# Patient Record
Sex: Male | Born: 1960 | Race: White | Hispanic: Yes | Marital: Married | Smoking: Former smoker
Health system: Southern US, Community
[De-identification: ages and names within clinical notes are randomized; demographics above are authoritative.]

## PROBLEM LIST (undated history)

## (undated) DIAGNOSIS — G629 Polyneuropathy, unspecified: Secondary | ICD-10-CM

## (undated) DIAGNOSIS — I10 Essential (primary) hypertension: Secondary | ICD-10-CM

## (undated) DIAGNOSIS — E039 Hypothyroidism, unspecified: Secondary | ICD-10-CM

## (undated) DIAGNOSIS — G4730 Sleep apnea, unspecified: Secondary | ICD-10-CM

## (undated) DIAGNOSIS — F419 Anxiety disorder, unspecified: Secondary | ICD-10-CM

## (undated) DIAGNOSIS — M1990 Unspecified osteoarthritis, unspecified site: Secondary | ICD-10-CM

## (undated) HISTORY — PX: KNEE ARTHROSCOPY: SUR90

## (undated) HISTORY — PX: OPEN ANTERIOR SHOULDER RECONSTRUCTION: SHX2100

## (undated) HISTORY — PX: VARICOSE VEIN SURGERY: SHX832

## (undated) HISTORY — PX: COLONOSCOPY: SHX174

## (undated) HISTORY — PX: SHOULDER ARTHROSCOPY: SHX128

---

## 2000-07-20 ENCOUNTER — Ambulatory Visit (HOSPITAL_BASED_OUTPATIENT_CLINIC_OR_DEPARTMENT_OTHER): Admission: RE | Admit: 2000-07-20 | Discharge: 2000-07-20 | Payer: Self-pay | Admitting: Orthopaedic Surgery

## 2014-10-23 ENCOUNTER — Other Ambulatory Visit: Payer: Self-pay | Admitting: Orthopedic Surgery

## 2014-10-24 ENCOUNTER — Encounter (HOSPITAL_COMMUNITY): Payer: Self-pay | Admitting: *Deleted

## 2014-10-24 ENCOUNTER — Ambulatory Visit (HOSPITAL_COMMUNITY): Payer: 59 | Admitting: Anesthesiology

## 2014-10-24 ENCOUNTER — Ambulatory Visit (HOSPITAL_COMMUNITY): Payer: 59

## 2014-10-24 ENCOUNTER — Ambulatory Visit (HOSPITAL_COMMUNITY)
Admission: RE | Admit: 2014-10-24 | Discharge: 2014-10-24 | Disposition: A | Discharge status: Home / Self Care | Payer: 59 | Source: Ambulatory Visit | Attending: Orthopedic Surgery | Admitting: Orthopedic Surgery

## 2014-10-24 ENCOUNTER — Encounter (HOSPITAL_COMMUNITY)
Admission: RE | Disposition: A | Discharge status: Home / Self Care | Payer: Self-pay | Source: Ambulatory Visit | Attending: Orthopedic Surgery

## 2014-10-24 DIAGNOSIS — I10 Essential (primary) hypertension: Secondary | ICD-10-CM | POA: Insufficient documentation

## 2014-10-24 DIAGNOSIS — Y9389 Activity, other specified: Secondary | ICD-10-CM | POA: Insufficient documentation

## 2014-10-24 DIAGNOSIS — Z87891 Personal history of nicotine dependence: Secondary | ICD-10-CM | POA: Diagnosis not present

## 2014-10-24 DIAGNOSIS — G4730 Sleep apnea, unspecified: Secondary | ICD-10-CM | POA: Diagnosis not present

## 2014-10-24 DIAGNOSIS — X58XXXA Exposure to other specified factors, initial encounter: Secondary | ICD-10-CM | POA: Insufficient documentation

## 2014-10-24 DIAGNOSIS — Z419 Encounter for procedure for purposes other than remedying health state, unspecified: Secondary | ICD-10-CM

## 2014-10-24 DIAGNOSIS — M5116 Intervertebral disc disorders with radiculopathy, lumbar region: Secondary | ICD-10-CM | POA: Diagnosis present

## 2014-10-24 DIAGNOSIS — Y9269 Other specified industrial and construction area as the place of occurrence of the external cause: Secondary | ICD-10-CM | POA: Diagnosis not present

## 2014-10-24 DIAGNOSIS — Y990 Civilian activity done for income or pay: Secondary | ICD-10-CM | POA: Diagnosis not present

## 2014-10-24 DIAGNOSIS — Z01818 Encounter for other preprocedural examination: Secondary | ICD-10-CM

## 2014-10-24 HISTORY — DX: Sleep apnea, unspecified: G47.30

## 2014-10-24 HISTORY — PX: LUMBAR LAMINECTOMY/DECOMPRESSION MICRODISCECTOMY: SHX5026

## 2014-10-24 HISTORY — DX: Essential (primary) hypertension: I10

## 2014-10-24 LAB — SURGICAL PCR SCREEN
MRSA, PCR: NEGATIVE
Staphylococcus aureus: NEGATIVE

## 2014-10-24 LAB — COMPREHENSIVE METABOLIC PANEL
ALT: 27 U/L (ref 0–53)
ANION GAP: 12 (ref 5–15)
AST: 20 U/L (ref 0–37)
Albumin: 3.9 g/dL (ref 3.5–5.2)
Alkaline Phosphatase: 57 U/L (ref 39–117)
BUN: 18 mg/dL (ref 6–23)
CALCIUM: 9.4 mg/dL (ref 8.4–10.5)
CO2: 25 mEq/L (ref 19–32)
Chloride: 101 mEq/L (ref 96–112)
Creatinine, Ser: 0.79 mg/dL (ref 0.50–1.35)
GFR calc non Af Amer: >90 mL/min (ref >90)
GLUCOSE: 103 mg/dL — AB (ref 70–99)
Potassium: 4.9 mEq/L (ref 3.7–5.3)
Sodium: 138 mEq/L (ref 137–147)
TOTAL PROTEIN: 7 g/dL (ref 6.0–8.3)
Total Bilirubin: 0.4 mg/dL (ref 0.3–1.2)

## 2014-10-24 LAB — TYPE AND SCREEN
ABO/RH(D): O POS
Antibody Screen: NEGATIVE

## 2014-10-24 LAB — CBC WITH DIFFERENTIAL/PLATELET
Basophils Absolute: 0.1 10*3/uL (ref 0.0–0.1)
Basophils Relative: 1 % (ref 0–1)
EOS ABS: 0.5 10*3/uL (ref 0.0–0.7)
Eosinophils Relative: 6 % — ABNORMAL HIGH (ref 0–5)
HCT: 41.6 % (ref 39.0–52.0)
Hemoglobin: 14.4 g/dL (ref 13.0–17.0)
LYMPHS ABS: 2.4 10*3/uL (ref 0.7–4.0)
Lymphocytes Relative: 29 % (ref 12–46)
MCH: 31.4 pg (ref 26.0–34.0)
MCHC: 34.6 g/dL (ref 30.0–36.0)
MCV: 90.6 fL (ref 78.0–100.0)
MONOS PCT: 9 % (ref 3–12)
Monocytes Absolute: 0.7 10*3/uL (ref 0.1–1.0)
Neutro Abs: 4.7 10*3/uL (ref 1.7–7.7)
Neutrophils Relative %: 55 % (ref 43–77)
Platelets: 257 10*3/uL (ref 150–400)
RBC: 4.59 MIL/uL (ref 4.22–5.81)
RDW: 13.4 % (ref 11.5–15.5)
WBC: 8.3 10*3/uL (ref 4.0–10.5)

## 2014-10-24 LAB — PROTIME-INR
INR: 0.99 (ref 0.00–1.49)
PROTHROMBIN TIME: 13.2 s (ref 11.6–15.2)

## 2014-10-24 LAB — ABO/RH: ABO/RH(D): O POS

## 2014-10-24 LAB — APTT: aPTT: 30 seconds (ref 24–37)

## 2014-10-24 SURGERY — LUMBAR LAMINECTOMY/DECOMPRESSION MICRODISCECTOMY
Anesthesia: General | Site: Back

## 2014-10-24 MED ORDER — ARTIFICIAL TEARS OP OINT
TOPICAL_OINTMENT | OPHTHALMIC | Status: AC
  Filled 2014-10-24: qty 3.5

## 2014-10-24 MED ORDER — PROPOFOL 10 MG/ML IV BOLUS
INTRAVENOUS | Status: AC
  Filled 2014-10-24: qty 20

## 2014-10-24 MED ORDER — LIDOCAINE HCL (CARDIAC) 20 MG/ML IV SOLN
INTRAVENOUS | Status: DC | PRN
  Administered 2014-10-24: 60 mg via INTRAVENOUS

## 2014-10-24 MED ORDER — GLYCOPYRROLATE 0.2 MG/ML IJ SOLN
INTRAMUSCULAR | Status: AC
  Filled 2014-10-24: qty 1

## 2014-10-24 MED ORDER — METHYLENE BLUE 1 % INJ SOLN
INTRAMUSCULAR | Status: AC
  Filled 2014-10-24: qty 10

## 2014-10-24 MED ORDER — FENTANYL CITRATE 0.05 MG/ML IJ SOLN
INTRAMUSCULAR | Status: DC | PRN
  Administered 2014-10-24 (×2): 50 ug via INTRAVENOUS
  Administered 2014-10-24: 100 ug via INTRAVENOUS
  Administered 2014-10-24 (×3): 50 ug via INTRAVENOUS

## 2014-10-24 MED ORDER — STERILE WATER FOR INJECTION IJ SOLN
INTRAMUSCULAR | Status: AC
  Filled 2014-10-24: qty 10

## 2014-10-24 MED ORDER — SURGIFOAM 100 EX MISC
CUTANEOUS | Status: DC | PRN
  Administered 2014-10-24: 09:00:00 via TOPICAL

## 2014-10-24 MED ORDER — METHYLENE BLUE 1 % INJ SOLN
INTRAMUSCULAR | Status: DC | PRN
  Administered 2014-10-24: 1 mL

## 2014-10-24 MED ORDER — GLYCOPYRROLATE 0.2 MG/ML IJ SOLN
INTRAMUSCULAR | Status: DC | PRN
  Administered 2014-10-24: 0.4 mg via INTRAVENOUS

## 2014-10-24 MED ORDER — MUPIROCIN 2 % EX OINT
TOPICAL_OINTMENT | CUTANEOUS | Status: AC
  Administered 2014-10-24: 1 via NASAL
  Filled 2014-10-24: qty 22

## 2014-10-24 MED ORDER — ONDANSETRON HCL 4 MG/2ML IJ SOLN
INTRAMUSCULAR | Status: AC
  Filled 2014-10-24: qty 2

## 2014-10-24 MED ORDER — HYDROMORPHONE HCL 1 MG/ML IJ SOLN
INTRAMUSCULAR | Status: AC
  Filled 2014-10-24: qty 1

## 2014-10-24 MED ORDER — VECURONIUM BROMIDE 10 MG IV SOLR
INTRAVENOUS | Status: AC
  Filled 2014-10-24: qty 10

## 2014-10-24 MED ORDER — METHYLPREDNISOLONE ACETATE 40 MG/ML IJ SUSP
INTRAMUSCULAR | Status: DC | PRN
Start: 2014-10-24 — End: 2014-10-24
  Administered 2014-10-24: 40 mg

## 2014-10-24 MED ORDER — DIAZEPAM 5 MG PO TABS
ORAL_TABLET | ORAL | Status: AC
  Administered 2014-10-24: 5 mg via ORAL
  Filled 2014-10-24: qty 1

## 2014-10-24 MED ORDER — ROCURONIUM BROMIDE 50 MG/5ML IV SOLN
INTRAVENOUS | Status: AC
  Filled 2014-10-24: qty 1

## 2014-10-24 MED ORDER — MIDAZOLAM HCL 5 MG/5ML IJ SOLN
INTRAMUSCULAR | Status: DC | PRN
  Administered 2014-10-24: 2 mg via INTRAVENOUS

## 2014-10-24 MED ORDER — HEMOSTATIC AGENTS (NO CHARGE) OPTIME
TOPICAL | Status: DC | PRN
  Administered 2014-10-24: 1 via TOPICAL

## 2014-10-24 MED ORDER — HYDROMORPHONE HCL 1 MG/ML IJ SOLN
INTRAMUSCULAR | Status: AC
  Administered 2014-10-24: 0.5 mg via INTRAVENOUS
  Filled 2014-10-24: qty 1

## 2014-10-24 MED ORDER — THROMBIN 20000 UNITS EX SOLR
CUTANEOUS | Status: AC
  Filled 2014-10-24: qty 20000

## 2014-10-24 MED ORDER — BUPIVACAINE-EPINEPHRINE 0.25% -1:200000 IJ SOLN
INTRAMUSCULAR | Status: DC | PRN
  Administered 2014-10-24: 15 mL

## 2014-10-24 MED ORDER — OXYCODONE HCL 5 MG/5ML PO SOLN: 5.0000 mg | Freq: Once | ORAL | Status: DC | PRN

## 2014-10-24 MED ORDER — CEFAZOLIN SODIUM-DEXTROSE 2-3 GM-% IV SOLR
INTRAVENOUS | Status: AC
  Filled 2014-10-24: qty 50

## 2014-10-24 MED ORDER — PHENYLEPHRINE 40 MCG/ML (10ML) SYRINGE FOR IV PUSH (FOR BLOOD PRESSURE SUPPORT)
PREFILLED_SYRINGE | INTRAVENOUS | Status: AC
  Filled 2014-10-24: qty 10

## 2014-10-24 MED ORDER — GLYCOPYRROLATE 0.2 MG/ML IJ SOLN
INTRAMUSCULAR | Status: AC
  Filled 2014-10-24: qty 3

## 2014-10-24 MED ORDER — CEFAZOLIN SODIUM-DEXTROSE 2-3 GM-% IV SOLR
2.0000 g | INTRAVENOUS | Status: AC
  Administered 2014-10-24: 2 g via INTRAVENOUS

## 2014-10-24 MED ORDER — SODIUM CHLORIDE 0.9 % IJ SOLN
INTRAMUSCULAR | Status: AC
  Filled 2014-10-24: qty 10

## 2014-10-24 MED ORDER — ARTIFICIAL TEARS OP OINT
TOPICAL_OINTMENT | OPHTHALMIC | Status: DC | PRN
  Administered 2014-10-24: 1 via OPHTHALMIC

## 2014-10-24 MED ORDER — FENTANYL CITRATE 0.05 MG/ML IJ SOLN
INTRAMUSCULAR | Status: AC
  Filled 2014-10-24: qty 5

## 2014-10-24 MED ORDER — EPHEDRINE SULFATE 50 MG/ML IJ SOLN
INTRAMUSCULAR | Status: AC
  Filled 2014-10-24: qty 1

## 2014-10-24 MED ORDER — NEOSTIGMINE METHYLSULFATE 10 MG/10ML IV SOLN
INTRAVENOUS | Status: AC
  Filled 2014-10-24: qty 1

## 2014-10-24 MED ORDER — GLYCOPYRROLATE 0.2 MG/ML IJ SOLN
INTRAMUSCULAR | Status: AC
Start: 2014-10-24 — End: 2014-10-24
  Filled 2014-10-24: qty 1

## 2014-10-24 MED ORDER — LIDOCAINE HCL (CARDIAC) 20 MG/ML IV SOLN
INTRAVENOUS | Status: AC
  Filled 2014-10-24: qty 5

## 2014-10-24 MED ORDER — PROPOFOL 10 MG/ML IV BOLUS
INTRAVENOUS | Status: DC | PRN
  Administered 2014-10-24: 20 mg via INTRAVENOUS
  Administered 2014-10-24: 200 mg via INTRAVENOUS
  Administered 2014-10-24: 30 mg via INTRAVENOUS

## 2014-10-24 MED ORDER — DEXAMETHASONE SODIUM PHOSPHATE 4 MG/ML IJ SOLN
INTRAMUSCULAR | Status: AC
  Filled 2014-10-24: qty 2

## 2014-10-24 MED ORDER — HYDROMORPHONE HCL 1 MG/ML IJ SOLN
0.2500 mg | INTRAMUSCULAR | Status: DC | PRN
  Administered 2014-10-24 (×3): 0.5 mg via INTRAVENOUS

## 2014-10-24 MED ORDER — ONDANSETRON HCL 4 MG/2ML IJ SOLN
INTRAMUSCULAR | Status: DC | PRN
  Administered 2014-10-24: 4 mg via INTRAVENOUS

## 2014-10-24 MED ORDER — NEOSTIGMINE METHYLSULFATE 10 MG/10ML IV SOLN
INTRAVENOUS | Status: DC | PRN
  Administered 2014-10-24: 3 mg via INTRAVENOUS

## 2014-10-24 MED ORDER — POVIDONE-IODINE 7.5 % EX SOLN
Freq: Once | CUTANEOUS | Status: DC
  Filled 2014-10-24: qty 118

## 2014-10-24 MED ORDER — MIDAZOLAM HCL 2 MG/2ML IJ SOLN
INTRAMUSCULAR | Status: AC
  Filled 2014-10-24: qty 2

## 2014-10-24 MED ORDER — DEXAMETHASONE SODIUM PHOSPHATE 4 MG/ML IJ SOLN
INTRAMUSCULAR | Status: DC | PRN
  Administered 2014-10-24: 8 mg via INTRAVENOUS

## 2014-10-24 MED ORDER — MUPIROCIN 2 % EX OINT: TOPICAL_OINTMENT | Freq: Once | CUTANEOUS | Status: DC

## 2014-10-24 MED ORDER — OXYCODONE HCL 5 MG PO TABS: 5.0000 mg | ORAL_TABLET | Freq: Once | ORAL | Status: DC | PRN

## 2014-10-24 MED ORDER — METHYLPREDNISOLONE ACETATE 40 MG/ML IJ SUSP
INTRAMUSCULAR | Status: AC
  Filled 2014-10-24: qty 1

## 2014-10-24 MED ORDER — BUPIVACAINE-EPINEPHRINE (PF) 0.25% -1:200000 IJ SOLN
INTRAMUSCULAR | Status: AC
  Filled 2014-10-24: qty 30

## 2014-10-24 MED ORDER — PHENYLEPHRINE HCL 10 MG/ML IJ SOLN
INTRAMUSCULAR | Status: DC | PRN
  Administered 2014-10-24 (×2): 40 ug via INTRAVENOUS
  Administered 2014-10-24: 80 ug via INTRAVENOUS

## 2014-10-24 MED ORDER — LACTATED RINGERS IV SOLN
INTRAVENOUS | Status: DC | PRN
  Administered 2014-10-24 (×3): via INTRAVENOUS

## 2014-10-24 MED ORDER — SUCCINYLCHOLINE CHLORIDE 20 MG/ML IJ SOLN
INTRAMUSCULAR | Status: AC
  Filled 2014-10-24: qty 1

## 2014-10-24 MED ORDER — DIAZEPAM 5 MG PO TABS
5.0000 mg | ORAL_TABLET | Freq: Once | ORAL | Status: AC
  Administered 2014-10-24: 5 mg via ORAL

## 2014-10-24 MED ORDER — VECURONIUM BROMIDE 10 MG IV SOLR
INTRAVENOUS | Status: DC | PRN
  Administered 2014-10-24: 1 mg via INTRAVENOUS
  Administered 2014-10-24: 2 mg via INTRAVENOUS

## 2014-10-24 MED ORDER — ROCURONIUM BROMIDE 100 MG/10ML IV SOLN
INTRAVENOUS | Status: DC | PRN
  Administered 2014-10-24: 50 mg via INTRAVENOUS

## 2014-10-24 SURGICAL SUPPLY — 72 items
APL SKNCLS STERI-STRIP NONHPOA (GAUZE/BANDAGES/DRESSINGS) ×1
BENZOIN TINCTURE PRP APPL 2/3 (GAUZE/BANDAGES/DRESSINGS) ×1 IMPLANT
BUR ROUND PRECISION 4.0 (BURR) ×2 IMPLANT
CANISTER SUCTION 2500CC (MISCELLANEOUS) ×2 IMPLANT
CARTRIDGE OIL MAESTRO DRILL (MISCELLANEOUS) ×1 IMPLANT
CLSR STERI-STRIP ANTIMIC 1/2X4 (GAUZE/BANDAGES/DRESSINGS) ×1 IMPLANT
CORDS BIPOLAR (ELECTRODE) ×2 IMPLANT
COVER SURGICAL LIGHT HANDLE (MISCELLANEOUS) ×2 IMPLANT
DIFFUSER DRILL AIR PNEUMATIC (MISCELLANEOUS) ×2 IMPLANT
DRAIN CHANNEL 15F RND FF W/TCR (WOUND CARE) IMPLANT
DRAPE POUCH INSTRU U-SHP 10X18 (DRAPES) ×4 IMPLANT
DRAPE SURG 17X23 STRL (DRAPES) ×8 IMPLANT
DURAPREP 26ML APPLICATOR (WOUND CARE) ×2 IMPLANT
ELECT BLADE 4.0 EZ CLEAN MEGAD (MISCELLANEOUS) ×2
ELECT CAUTERY BLADE 6.4 (BLADE) ×2 IMPLANT
ELECT REM PT RETURN 9FT ADLT (ELECTROSURGICAL) ×2
ELECTRODE BLDE 4.0 EZ CLN MEGD (MISCELLANEOUS) IMPLANT
ELECTRODE REM PT RTRN 9FT ADLT (ELECTROSURGICAL) ×1 IMPLANT
EVACUATOR SILICONE 100CC (DRAIN) IMPLANT
FILTER STRAW FLUID ASPIR (MISCELLANEOUS) ×2 IMPLANT
GAUZE SPONGE 4X4 12PLY STRL (GAUZE/BANDAGES/DRESSINGS) ×2 IMPLANT
GAUZE SPONGE 4X4 16PLY XRAY LF (GAUZE/BANDAGES/DRESSINGS) ×4 IMPLANT
GLOVE BIO SURGEON STRL SZ7 (GLOVE) ×2 IMPLANT
GLOVE BIO SURGEON STRL SZ8 (GLOVE) ×2 IMPLANT
GLOVE BIOGEL PI IND STRL 7.0 (GLOVE) ×1 IMPLANT
GLOVE BIOGEL PI IND STRL 8 (GLOVE) ×1 IMPLANT
GLOVE BIOGEL PI INDICATOR 7.0 (GLOVE) ×1
GLOVE BIOGEL PI INDICATOR 8 (GLOVE) ×1
GOWN STRL REUS W/ TWL LRG LVL3 (GOWN DISPOSABLE) ×1 IMPLANT
GOWN STRL REUS W/ TWL XL LVL3 (GOWN DISPOSABLE) ×2 IMPLANT
GOWN STRL REUS W/TWL LRG LVL3 (GOWN DISPOSABLE) ×2
GOWN STRL REUS W/TWL XL LVL3 (GOWN DISPOSABLE) ×4
IV CATH 14GX2 1/4 (CATHETERS) ×2 IMPLANT
KIT BASIN OR (CUSTOM PROCEDURE TRAY) ×2 IMPLANT
KIT POSITION SURG JACKSON T1 (MISCELLANEOUS) ×2 IMPLANT
KIT ROOM TURNOVER OR (KITS) ×2 IMPLANT
NDL 18GX1X1/2 (RX/OR ONLY) (NEEDLE) ×1 IMPLANT
NDL HYPO 25GX1X1/2 BEV (NEEDLE) ×1 IMPLANT
NDL SPNL 18GX3.5 QUINCKE PK (NEEDLE) ×2 IMPLANT
NEEDLE 18GX1X1/2 (RX/OR ONLY) (NEEDLE) ×2 IMPLANT
NEEDLE 22X1 1/2 (OR ONLY) (NEEDLE) ×2 IMPLANT
NEEDLE HYPO 25GX1X1/2 BEV (NEEDLE) ×2 IMPLANT
NEEDLE SPNL 18GX3.5 QUINCKE PK (NEEDLE) ×4 IMPLANT
NS IRRIG 1000ML POUR BTL (IV SOLUTION) ×2 IMPLANT
OIL CARTRIDGE MAESTRO DRILL (MISCELLANEOUS) ×2
PACK LAMINECTOMY ORTHO (CUSTOM PROCEDURE TRAY) ×2 IMPLANT
PACK UNIVERSAL I (CUSTOM PROCEDURE TRAY) ×2 IMPLANT
PAD ARMBOARD 7.5X6 YLW CONV (MISCELLANEOUS) ×4 IMPLANT
PATTIES SURGICAL .5 X.5 (GAUZE/BANDAGES/DRESSINGS) ×1 IMPLANT
PATTIES SURGICAL .5 X1 (DISPOSABLE) ×2 IMPLANT
SPONGE INTESTINAL PEANUT (DISPOSABLE) ×2 IMPLANT
SPONGE SURGIFOAM ABS GEL 100 (HEMOSTASIS) ×2 IMPLANT
SPONGE SURGIFOAM ABS GEL SZ50 (HEMOSTASIS) ×2 IMPLANT
STRIP CLOSURE SKIN 1/2X4 (GAUZE/BANDAGES/DRESSINGS) ×1 IMPLANT
SURGIFLO TRUKIT (HEMOSTASIS) IMPLANT
SUT MNCRL AB 4-0 PS2 18 (SUTURE) ×2 IMPLANT
SUT VIC AB 0 CT1 18XCR BRD 8 (SUTURE) IMPLANT
SUT VIC AB 0 CT1 27 (SUTURE) ×2
SUT VIC AB 0 CT1 27XBRD ANBCTR (SUTURE) IMPLANT
SUT VIC AB 0 CT1 8-18 (SUTURE) ×2
SUT VIC AB 1 CT1 18XCR BRD 8 (SUTURE) ×1 IMPLANT
SUT VIC AB 1 CT1 8-18 (SUTURE) ×2
SUT VIC AB 2-0 CT2 18 VCP726D (SUTURE) ×2 IMPLANT
SYR 20CC LL (SYRINGE) IMPLANT
SYR BULB IRRIGATION 50ML (SYRINGE) ×2 IMPLANT
SYR CONTROL 10ML LL (SYRINGE) ×4 IMPLANT
SYR TB 1ML 26GX3/8 SAFETY (SYRINGE) ×4 IMPLANT
SYR TB 1ML LUER SLIP (SYRINGE) ×4 IMPLANT
TOWEL OR 17X24 6PK STRL BLUE (TOWEL DISPOSABLE) ×2 IMPLANT
TOWEL OR 17X26 10 PK STRL BLUE (TOWEL DISPOSABLE) ×2 IMPLANT
WATER STERILE IRR 1000ML POUR (IV SOLUTION) ×2 IMPLANT
YANKAUER SUCT BULB TIP NO VENT (SUCTIONS) ×2 IMPLANT

## 2014-10-24 NOTE — Transfer of Care (Signed)
Immediate Anesthesia Transfer of Care Note  Patient: [name] [name]  Procedure(s) Performed: Procedure(s) with comments: LUMBAR LAMINECTOMY/DECOMPRESSION MICRODISCECTOMY (N/A) - Lumbar 5-sacrum 1 decompression  Patient Location: PACU  Anesthesia Type:General  Level of Consciousness: awake, alert , oriented and patient cooperative  Airway & Oxygen Therapy: Patient Spontanous Breathing and Patient connected to face mask oxygen  Post-op Assessment: Report given to PACU RN and Post -op Vital signs reviewed and stable  Post vital signs: Reviewed  Complications: No apparent anesthesia complications

## 2014-10-24 NOTE — Anesthesia Preprocedure Evaluation (Addendum)
Anesthesia Evaluation  Patient identified by MRN, date of birth, ID band Patient awake    Reviewed: Allergy & Precautions, H&P , NPO status , Patient's Chart, lab work & pertinent test results  History of Anesthesia Complications Negative for: history of anesthetic complications  Airway Mallampati: II  TM Distance: >3 FB Neck ROM: Full    Dental no notable dental hx. (+) Teeth Intact, Dental Advisory Given, Partial Upper   Pulmonary sleep apnea and Continuous Positive Airway Pressure Ventilation , former smoker,  breath sounds clear to auscultation  Pulmonary exam normal       Cardiovascular hypertension, On Medications negative cardio ROS  Rhythm:Regular Rate:Normal     Neuro/Psych negative neurological ROS  negative psych ROS   GI/Hepatic negative GI ROS, Neg liver ROS,   Endo/Other  negative endocrine ROS  Renal/GU negative Renal ROS  negative genitourinary   Musculoskeletal   Abdominal   Peds  Hematology negative hematology ROS (+)   Anesthesia Other Findings   Reproductive/Obstetrics negative OB ROS                           Anesthesia Physical Anesthesia Plan  ASA: III  Anesthesia Plan: General   Post-op Pain Management:    Induction: Intravenous  Airway Management Planned: Oral ETT  Additional Equipment:   Intra-op Plan:   Post-operative Plan: Extubation in OR  Informed Consent: I have reviewed the patients History and Physical, chart, labs and discussed the procedure including the risks, benefits and alternatives for the proposed anesthesia with the patient or authorized representative who has indicated his/her understanding and acceptance.   Dental advisory given  Plan Discussed with: CRNA  Anesthesia Plan Comments:        Anesthesia Quick Evaluation

## 2014-10-24 NOTE — Anesthesia Procedure Notes (Signed)
Procedure Name: Intubation Date/Time: 10/24/2014 8:38 AM Performed by: Lovie CholOCK, Kinsler Soeder K Pre-anesthesia Checklist: Patient identified, Emergency Drugs available, Suction available, Patient being monitored and Timeout performed Patient Re-evaluated:Patient Re-evaluated prior to inductionOxygen Delivery Method: Circle system utilized Preoxygenation: Pre-oxygenation with 100% oxygen Intubation Type: IV induction Ventilation: Mask ventilation without difficulty and Oral airway inserted - appropriate to patient size Laryngoscope Size: Miller and 3 Grade View: Grade I Tube type: Oral Tube size: 7.5 mm Number of attempts: 1 Airway Equipment and Method: Stylet Placement Confirmation: ETT inserted through vocal cords under direct vision,  positive ETCO2,  CO2 detector and breath sounds checked- equal and bilateral Secured at: 22 cm Tube secured with: Tape Dental Injury: Teeth and Oropharynx as per pre-operative assessment  Comments: Soft bite block utilized.

## 2014-10-24 NOTE — Op Note (Signed)
NAMHeloise [name]:  [name], [name]             ACCOUNT NO.:  [id number]  MEDICAL RECORD NO.:  [id number]  LOCATION:  [facility]                         FACILITY:  [facility]  PHYSICIAN:  Estill BambergMark Shevy Yaney, MD      DATE OF BIRTH:  1961-03-22  DATE OF PROCEDURE:  10/24/2014 DATE OF DISCHARGE:  10/24/2014                              OPERATIVE REPORT   PREOPERATIVE DIAGNOSES: 1. Severe right-sided L5 radiculopathy. 2. Right-sided L5-S1 extruded disk herniation with severe compression     of the exiting L5 nerve and the neural foramen on the right side.  POSTOPERATIVE DIAGNOSES: 1. Severe right-sided L5 radiculopathy. 2. Right-sided L5-S1 extruded disk herniation with severe compression     of the exiting L5 nerve and the neural foramen on the right side.  SURGEON:  Estill BambergMark Aleister Lady, MD  ASSISTANT:  Jason CoopKayla McKenzie, PA-C  ANESTHESIA:  General endotracheal anesthesia.  COMPLICATIONS:  None.  DISPOSITION:  Stable.  ESTIMATED BLOOD LOSS:  Minimal.  INDICATIONS FOR SURGERY:  Briefly, Mr. [name] HarpsHernandez is a very pleasant 53- year-old male who did sustain a work injury on September 27, 2014, after lifting a heavy box at work.  He felt a pop in his low back and went onto have right leg pain.  An MRI did clearly revealed an extruded disk fragment at the L5-S1 level, migrated into the neural foramen and lateral to the neural foramen on the right side.  This clearly was causing severe compression on the L5 nerve.  The patient did go forward with multiple forms of conservative care and did continue to have pain. We therefore did discuss proceeding with an L5-S1 decompressive procedure.  The patient did elect to proceed.  OPERATIVE DETAILS:  On October 24, 2014, the patient was brought to Surgery and general endotracheal anesthesia was administered.  The patient was placed prone on a well-padded flat Jackson bed with a spinal frame.  Antibiotics were given.  A midline incision was made over the L5- S1 interspace.  The  fascia was incised in the midline and the paraspinal musculature was retracted.  The spinous process of L5 was removed, then a bilateral partial facetectomy was performed at L5-S1.  I then performed a lateral partial facetectomy on the right side, in order to allow myself the ability to gain access to the foraminal and extraforaminal region.  I was ultimately able to identify the exiting L5 nerve, which clearly was noted to be under substantial tension, and clearly was compressed against the L5 pedicle secondary to a disk herniation into the neuroforaminal area.  I then proceeded with freeing the L5 nerve from the herniated disk fragment.  This was successfully performed.  I then was able to remove all compression of the exiting L5 nerve by removing multiple intervertebral disk fragments located in the neural foramen.  I did use a up-angled micropituitary rongeur to continue the decompression into the far extraforaminal region to ensure adequate decompression of the nerve.  I was very pleased with the decompression that I was able to accomplish.  The wound was then copiously irrigated.  Bleeding was controlled using bipolar electrocautery in addition to SurgiFlo.  A 40 mg of Depo-Medrol was infiltrated about the epidural space in  the region of the L5 nerve.  The fascia was then closed using a normal #1 Vicryl, the subcutaneous layer was closed using 2-0 Vicryl, and the skin was closed using 3-0 Monocryl. Benzoin and Steri-Strips were applied followed by sterile dressing.  Jason CoopKayla McKenzie, PA-C was my assistant throughout surgery, did aid in retraction, suctioning and closure.     Estill BambergMark Alexi Geibel, MD     MD/MEDQ  D:  10/24/2014  T:  10/24/2014  Job:  [id number]

## 2014-10-24 NOTE — H&P (Signed)
     PREOPERATIVE H&P  Chief Complaint: right leg pain  HPI: [name] [name] is a 53 y.o. male who presents with ongoing pain in the right leg s/p injury on 101/5  MRI reveals stenosis at L5/S1  Patient has failed multiple forms of conservative care and continues to have severe pain (see office notes for additional details regarding the patient's full course of treatment)  Past Medical History  Diagnosis Date  . Hypertension   . Sleep apnea    Past Surgical History  Procedure Laterality Date  . Shoulder arthroscopy  5-10 years ago    both shoulder  . Knee arthroscopy  5-10 years ago     both knees  . Open anterior shoulder reconstruction  10 years ago  . Varicose vein surgery Bilateral 4 yrs ago    History   Social History  . Marital Status: Married    Spouse Name: N/A    Number of Children: N/A  . Years of Education: N/A   Social History Main Topics  . Smoking status: Former Smoker    Types: Cigarettes  . Smokeless tobacco: Not on file  . Alcohol Use: No  . Drug Use: No  . Sexual Activity: Not on file   Other Topics Concern  . Not on file   Social History Narrative  . No narrative on file   No family history on file. No Known Allergies Prior to Admission medications   Medication Sig Start Date End Date Taking? Authorizing Provider  ALPRAZolam Prudy Feeler(XANAX) 0.5 MG tablet Take 1 mg by mouth daily.   Yes Historical Provider, MD  atorvastatin (LIPITOR) 20 MG tablet Take 20 mg by mouth daily.   Yes Historical Provider, MD  celecoxib (CELEBREX) 200 MG capsule Take 200 mg by mouth daily.   Yes Historical Provider, MD  gabapentin (NEURONTIN) 300 MG capsule Take 300 mg by mouth 3 (three) times daily.   Yes Historical Provider, MD  levothyroxine (SYNTHROID, LEVOTHROID) 150 MCG tablet Take 150 mcg by mouth daily before breakfast.   Yes Historical Provider, MD  lisinopril-hydrochlorothiazide (PRINZIDE,ZESTORETIC) 20-12.5 MG per tablet Take 1 tablet by mouth daily.   Yes  Historical Provider, MD  oxyCODONE-acetaminophen (PERCOCET/ROXICET) 5-325 MG per tablet Take 1 tablet by mouth every 4 (four) hours as needed for severe pain (pain).   Yes Historical Provider, MD     All other systems have been reviewed and were otherwise negative with the exception of those mentioned in the HPI and as above.  Physical Exam: Filed Vitals:   10/24/14 0707  BP: 135/89  Pulse: 87  Temp: 98 F (36.7 C)  Resp: 20    General: Alert, no acute distress Cardiovascular: No pedal edema Respiratory: No cyanosis, no use of accessory musculature Skin: No lesions in the area of chief complaint Neurologic: Sensation intact distally Psychiatric: Patient is competent for consent with normal mood and affect Lymphatic: No axillary or cervical lymphadenopathy  MUSCULOSKELETAL: + SLR on right  Assessment/Plan: Right leg pain and low back pain Plan for Procedure(s): LUMBAR LAMINECTOMY L5/S1   Emilee HeroUMONSKI,Nayla Dias LEONARD, MD 10/24/2014 8:10 AM

## 2014-10-24 NOTE — Anesthesia Postprocedure Evaluation (Signed)
  Anesthesia Post-op Note  Patient: [name] [name]  Procedure(s) Performed: Procedure(s) with comments: LUMBAR LAMINECTOMY/DECOMPRESSION MICRODISCECTOMY (N/A) - Lumbar 5-sacrum 1 decompression  Patient Location: PACU  Anesthesia Type:General  Level of Consciousness: awake and alert   Airway and Oxygen Therapy: Patient Spontanous Breathing  Post-op Pain: moderate  Post-op Assessment: Post-op Vital signs reviewed, Patient's Cardiovascular Status Stable and Respiratory Function Stable  Post-op Vital Signs: Reviewed  Filed Vitals:   10/24/14 1218  BP: 130/78  Pulse: 85  Temp:   Resp: 16    Complications: No apparent anesthesia complications

## 2014-10-25 ENCOUNTER — Encounter (HOSPITAL_COMMUNITY): Payer: Self-pay | Admitting: Orthopedic Surgery

## 2016-09-04 ENCOUNTER — Ambulatory Visit (HOSPITAL_BASED_OUTPATIENT_CLINIC_OR_DEPARTMENT_OTHER)
Admission: RE | Admit: 2016-09-04 | Discharge: 2016-09-04 | Disposition: A | Discharge status: Home / Self Care | Payer: Worker's Compensation | Source: Ambulatory Visit | Attending: Orthopaedic Surgery | Admitting: Orthopaedic Surgery

## 2016-09-04 ENCOUNTER — Other Ambulatory Visit: Payer: Self-pay | Admitting: Orthopaedic Surgery

## 2016-09-04 ENCOUNTER — Ambulatory Visit (HOSPITAL_BASED_OUTPATIENT_CLINIC_OR_DEPARTMENT_OTHER): Payer: Worker's Compensation | Admitting: Anesthesiology

## 2016-09-04 ENCOUNTER — Encounter (HOSPITAL_BASED_OUTPATIENT_CLINIC_OR_DEPARTMENT_OTHER): Payer: Self-pay | Admitting: *Deleted

## 2016-09-04 ENCOUNTER — Encounter (HOSPITAL_BASED_OUTPATIENT_CLINIC_OR_DEPARTMENT_OTHER)
Admission: RE | Disposition: A | Discharge status: Home / Self Care | Payer: Self-pay | Source: Ambulatory Visit | Attending: Orthopaedic Surgery

## 2016-09-04 DIAGNOSIS — Z79899 Other long term (current) drug therapy: Secondary | ICD-10-CM | POA: Insufficient documentation

## 2016-09-04 DIAGNOSIS — I10 Essential (primary) hypertension: Secondary | ICD-10-CM | POA: Insufficient documentation

## 2016-09-04 DIAGNOSIS — M25512 Pain in left shoulder: Secondary | ICD-10-CM | POA: Insufficient documentation

## 2016-09-04 DIAGNOSIS — Z87891 Personal history of nicotine dependence: Secondary | ICD-10-CM | POA: Insufficient documentation

## 2016-09-04 DIAGNOSIS — G4730 Sleep apnea, unspecified: Secondary | ICD-10-CM | POA: Insufficient documentation

## 2016-09-04 HISTORY — PX: IRRIGATION AND DEBRIDEMENT SHOULDER: SHX5880

## 2016-09-04 SURGERY — IRRIGATION AND DEBRIDEMENT SHOULDER
Anesthesia: General | Site: Shoulder | Laterality: Left

## 2016-09-04 MED ORDER — ONDANSETRON HCL 4 MG/2ML IJ SOLN
4.0000 mg | Freq: Once | INTRAMUSCULAR | Status: AC
  Administered 2016-09-04: 4 mg via INTRAVENOUS

## 2016-09-04 MED ORDER — FENTANYL CITRATE (PF) 100 MCG/2ML IJ SOLN
INTRAMUSCULAR | Status: AC
  Filled 2016-09-04: qty 2

## 2016-09-04 MED ORDER — ALBUTEROL SULFATE HFA 108 (90 BASE) MCG/ACT IN AERS
INHALATION_SPRAY | RESPIRATORY_TRACT | Status: DC | PRN
  Administered 2016-09-04 (×2): 2 via RESPIRATORY_TRACT

## 2016-09-04 MED ORDER — LACTATED RINGERS IV SOLN: INTRAVENOUS | Status: DC

## 2016-09-04 MED ORDER — VANCOMYCIN HCL IN DEXTROSE 1-5 GM/200ML-% IV SOLN
1000.0000 mg | Freq: Once | INTRAVENOUS | Status: AC
  Administered 2016-09-04 (×2): 1000 mg via INTRAVENOUS

## 2016-09-04 MED ORDER — MIDAZOLAM HCL 2 MG/2ML IJ SOLN
INTRAMUSCULAR | Status: AC
  Filled 2016-09-04: qty 2

## 2016-09-04 MED ORDER — HYDROMORPHONE HCL 1 MG/ML IJ SOLN
0.2500 mg | INTRAMUSCULAR | Status: DC | PRN
  Administered 2016-09-04 (×2): 0.5 mg via INTRAVENOUS

## 2016-09-04 MED ORDER — VANCOMYCIN HCL IN DEXTROSE 1-5 GM/200ML-% IV SOLN
INTRAVENOUS | Status: AC
  Filled 2016-09-04: qty 200

## 2016-09-04 MED ORDER — PROPOFOL 10 MG/ML IV BOLUS
INTRAVENOUS | Status: AC
  Filled 2016-09-04: qty 20

## 2016-09-04 MED ORDER — OXYCODONE HCL 5 MG PO TABS
5.0000 mg | ORAL_TABLET | ORAL | Status: DC | PRN
  Administered 2016-09-04: 5 mg via ORAL

## 2016-09-04 MED ORDER — CEFAZOLIN SODIUM-DEXTROSE 2-4 GM/100ML-% IV SOLN: 2.0000 g | INTRAVENOUS | Status: DC

## 2016-09-04 MED ORDER — SCOPOLAMINE 1 MG/3DAYS TD PT72: 1.0000 | MEDICATED_PATCH | Freq: Once | TRANSDERMAL | Status: DC

## 2016-09-04 MED ORDER — SUCCINYLCHOLINE CHLORIDE 20 MG/ML IJ SOLN
INTRAMUSCULAR | Status: DC | PRN
  Administered 2016-09-04: 120 mg via INTRAVENOUS

## 2016-09-04 MED ORDER — HYDROCODONE-ACETAMINOPHEN 10-325 MG PO TABS: 1.0000 | ORAL_TABLET | ORAL | 0 refills | Status: DC | PRN

## 2016-09-04 MED ORDER — MIDAZOLAM HCL 2 MG/2ML IJ SOLN
1.0000 mg | INTRAMUSCULAR | Status: DC | PRN
  Administered 2016-09-04: 2 mg via INTRAVENOUS

## 2016-09-04 MED ORDER — DEXAMETHASONE SODIUM PHOSPHATE 4 MG/ML IJ SOLN
INTRAMUSCULAR | Status: DC | PRN
  Administered 2016-09-04: 10 mg via INTRAVENOUS

## 2016-09-04 MED ORDER — FENTANYL CITRATE (PF) 100 MCG/2ML IJ SOLN
50.0000 ug | INTRAMUSCULAR | Status: DC | PRN
  Administered 2016-09-04: 100 ug via INTRAVENOUS

## 2016-09-04 MED ORDER — PROPOFOL 10 MG/ML IV BOLUS
INTRAVENOUS | Status: DC | PRN
  Administered 2016-09-04: 300 mg via INTRAVENOUS

## 2016-09-04 MED ORDER — DOXYCYCLINE HYCLATE 50 MG PO CAPS: 100.0000 mg | ORAL_CAPSULE | Freq: Two times a day (BID) | ORAL | 0 refills | Status: AC

## 2016-09-04 MED ORDER — BUPIVACAINE-EPINEPHRINE (PF) 0.5% -1:200000 IJ SOLN
INTRAMUSCULAR | Status: AC
  Filled 2016-09-04: qty 30

## 2016-09-04 MED ORDER — SUGAMMADEX SODIUM 200 MG/2ML IV SOLN
INTRAVENOUS | Status: DC | PRN
  Administered 2016-09-04: 250 mg via INTRAVENOUS

## 2016-09-04 MED ORDER — POLYMYXIN B SULFATE 500000 UNITS IJ SOLR
INTRAMUSCULAR | Status: DC | PRN
  Administered 2016-09-04: 500 mL

## 2016-09-04 MED ORDER — LACTATED RINGERS IV SOLN
INTRAVENOUS | Status: DC
  Administered 2016-09-04 (×2): via INTRAVENOUS

## 2016-09-04 MED ORDER — HYDROMORPHONE HCL 1 MG/ML IJ SOLN
INTRAMUSCULAR | Status: AC
  Filled 2016-09-04: qty 1

## 2016-09-04 MED ORDER — CHLORHEXIDINE GLUCONATE 4 % EX LIQD: 60.0000 mL | Freq: Once | CUTANEOUS | Status: DC

## 2016-09-04 MED ORDER — POVIDONE-IODINE 10 % EX SWAB: 2.0000 "application " | Freq: Once | CUTANEOUS | Status: DC

## 2016-09-04 MED ORDER — OXYCODONE HCL 5 MG PO TABS
ORAL_TABLET | ORAL | Status: AC
  Filled 2016-09-04: qty 1

## 2016-09-04 MED ORDER — ROCURONIUM 10MG/ML (10ML) SYRINGE FOR MEDFUSION PUMP - OPTIME
INTRAVENOUS | Status: DC | PRN
  Administered 2016-09-04: 30 mg via INTRAVENOUS

## 2016-09-04 MED ORDER — SODIUM CHLORIDE 0.9 % IR SOLN
Status: DC | PRN
  Administered 2016-09-04: 9000 mL

## 2016-09-04 MED ORDER — BUPIVACAINE-EPINEPHRINE 0.5% -1:200000 IJ SOLN
INTRAMUSCULAR | Status: DC | PRN
  Administered 2016-09-04: 20 mL

## 2016-09-04 MED ORDER — LIDOCAINE HCL (CARDIAC) 20 MG/ML IV SOLN
INTRAVENOUS | Status: DC | PRN
  Administered 2016-09-04: 50 mg via INTRAVENOUS

## 2016-09-04 SURGICAL SUPPLY — 59 items
ADH SKN CLS APL DERMABOND .7 (GAUZE/BANDAGES/DRESSINGS)
APL SKNCLS STERI-STRIP NONHPOA (GAUZE/BANDAGES/DRESSINGS)
BENZOIN TINCTURE PRP APPL 2/3 (GAUZE/BANDAGES/DRESSINGS) IMPLANT
BLADE CUDA 5.5 (BLADE) IMPLANT
BLADE GREAT WHITE 4.2 (BLADE) ×2 IMPLANT
BLADE GREAT WHITE 4.2MM (BLADE) ×1
BLADE SURG 15 STRL LF DISP TIS (BLADE) IMPLANT
BLADE SURG 15 STRL SS (BLADE)
BUR VERTEX HOODED 4.5 (BURR) IMPLANT
CANNULA SHOULDER 7CM (CANNULA) ×3 IMPLANT
CANNULA TWIST IN 8.25X7CM (CANNULA) IMPLANT
CLOSURE WOUND 1/2 X4 (GAUZE/BANDAGES/DRESSINGS)
DECANTER SPIKE VIAL GLASS SM (MISCELLANEOUS) ×2 IMPLANT
DERMABOND ADVANCED (GAUZE/BANDAGES/DRESSINGS)
DERMABOND ADVANCED .7 DNX12 (GAUZE/BANDAGES/DRESSINGS) IMPLANT
DRAPE STERI 35X30 U-POUCH (DRAPES) ×3 IMPLANT
DRAPE U-SHAPE 47X51 STRL (DRAPES) ×3 IMPLANT
DRAPE U-SHAPE 76X120 STRL (DRAPES) ×6 IMPLANT
DRSG EMULSION OIL 3X3 NADH (GAUZE/BANDAGES/DRESSINGS) ×3 IMPLANT
DRSG PAD ABDOMINAL 8X10 ST (GAUZE/BANDAGES/DRESSINGS) ×3 IMPLANT
DURAPREP 26ML APPLICATOR (WOUND CARE) ×3 IMPLANT
ELECT MENISCUS 165MM 90D (ELECTRODE) IMPLANT
ELECT REM PT RETURN 9FT ADLT (ELECTROSURGICAL) ×3
ELECTRODE REM PT RTRN 9FT ADLT (ELECTROSURGICAL) ×1 IMPLANT
GAUZE SPONGE 4X4 12PLY STRL (GAUZE/BANDAGES/DRESSINGS) ×3 IMPLANT
GLOVE BIO SURGEON STRL SZ8 (GLOVE) ×6 IMPLANT
GLOVE BIOGEL PI IND STRL 8 (GLOVE) ×2 IMPLANT
GLOVE BIOGEL PI INDICATOR 8 (GLOVE) ×4
GOWN STRL REUS W/ TWL LRG LVL3 (GOWN DISPOSABLE) ×2 IMPLANT
GOWN STRL REUS W/ TWL XL LVL3 (GOWN DISPOSABLE) ×2 IMPLANT
GOWN STRL REUS W/TWL LRG LVL3 (GOWN DISPOSABLE) ×6
GOWN STRL REUS W/TWL XL LVL3 (GOWN DISPOSABLE) ×6
MANIFOLD NEPTUNE II (INSTRUMENTS) ×3 IMPLANT
NS IRRIG 1000ML POUR BTL (IV SOLUTION) IMPLANT
PACK ARTHROSCOPY DSU (CUSTOM PROCEDURE TRAY) ×3 IMPLANT
PACK BASIN DAY SURGERY FS (CUSTOM PROCEDURE TRAY) ×3 IMPLANT
PENCIL BUTTON HOLSTER BLD 10FT (ELECTRODE) IMPLANT
PROBE BIPOLAR ATHRO 135MM 90D (MISCELLANEOUS) IMPLANT
SET ARTHROSCOPY TUBING (MISCELLANEOUS) ×3
SET ARTHROSCOPY TUBING LN (MISCELLANEOUS) ×1 IMPLANT
SHEET MEDIUM DRAPE 40X70 STRL (DRAPES) ×3 IMPLANT
SLEEVE SCD COMPRESS KNEE MED (MISCELLANEOUS) ×2 IMPLANT
SLING ARM FOAM STRAP LRG (SOFTGOODS) IMPLANT
SLING ARM SM FOAM STRAP (SOFTGOODS) IMPLANT
SLING ARM XL FOAM STRAP (SOFTGOODS) ×2 IMPLANT
SPONGE LAP 4X18 X RAY DECT (DISPOSABLE) IMPLANT
STRIP CLOSURE SKIN 1/2X4 (GAUZE/BANDAGES/DRESSINGS) IMPLANT
SUCTION FRAZIER HANDLE 10FR (MISCELLANEOUS)
SUCTION TUBE FRAZIER 10FR DISP (MISCELLANEOUS) IMPLANT
SUT ETHILON 3 0 PS 1 (SUTURE) ×3 IMPLANT
SUT VIC AB 0 SH 27 (SUTURE) IMPLANT
SUT VIC AB 2-0 SH 27 (SUTURE)
SUT VIC AB 2-0 SH 27XBRD (SUTURE) IMPLANT
SUT VICRYL 4-0 PS2 18IN ABS (SUTURE) IMPLANT
SYR BULB 3OZ (MISCELLANEOUS) IMPLANT
TOWEL OR 17X24 6PK STRL BLUE (TOWEL DISPOSABLE) ×3 IMPLANT
TOWEL OR NON WOVEN STRL DISP B (DISPOSABLE) ×3 IMPLANT
WATER STERILE IRR 1000ML POUR (IV SOLUTION) ×3 IMPLANT
YANKAUER SUCT BULB TIP NO VENT (SUCTIONS) IMPLANT

## 2016-09-04 NOTE — Interval H&P Note (Signed)
History and Physical Interval Note:  09/04/2016 1:14 PM  [name] [name]  has presented today for surgery, with the diagnosis of INFECTED LEFT SHOULDER  The various methods of treatment have been discussed with the patient and family. After consideration of risks, benefits and other options for treatment, the patient has consented to  Procedure(s): IRRIGATION AND DEBRIDEMENT SHOULDER (Left) as a surgical intervention .  The patient's history has been reviewed, patient examined, no change in status, stable for surgery.  I have reviewed the patient's chart and labs.  Questions were answered to the patient's satisfaction.     Gifford Ballon G

## 2016-09-04 NOTE — H&P (Signed)
[name] [name] is an 55 y.o. male.   Chief Complaint: left shoulder pain HPI: [name] [name] continues with significant drainage from his left shoulder. He has been on ciprofloxacin for a couple of days after Keflex for about a week. He does not feel ill.  Data: The wound culture from 2 days ago is no growth and the gram strain is negative.  Past Medical History:  Diagnosis Date  . Hypertension   . Sleep apnea     Past Surgical History:  Procedure Laterality Date  . KNEE ARTHROSCOPY  5-10 years ago    both knees  . LUMBAR LAMINECTOMY/DECOMPRESSION MICRODISCECTOMY N/A 10/24/2014   Procedure: LUMBAR LAMINECTOMY/DECOMPRESSION MICRODISCECTOMY;  Surgeon: Emilee Hero, MD;  Location: [facility] OR;  Service: Orthopedics;  Laterality: N/A;  Lumbar 5-sacrum 1 decompression  . OPEN ANTERIOR SHOULDER RECONSTRUCTION  10 years ago  . SHOULDER ARTHROSCOPY  5-10 years ago   both shoulder  . VARICOSE VEIN SURGERY Bilateral 4 yrs ago     History reviewed. No pertinent family history. Social History:  reports that he has quit smoking. His smoking use included Cigarettes. He has never used smokeless tobacco. He reports that he does not drink alcohol or use drugs.  Allergies: No Known Allergies  Medications Prior to Admission  Medication Sig Dispense Refill  . ALPRAZolam (XANAX) 0.5 MG tablet Take 1 mg by mouth daily.    Marland Kitchen atorvastatin (LIPITOR) 20 MG tablet Take 20 mg by mouth daily.    . celecoxib (CELEBREX) 200 MG capsule Take 200 mg by mouth daily.    . ciprofloxacin (CIPRO) 750 MG tablet Take 750 mg by mouth 2 (two) times daily.    Marland Kitchen levothyroxine (SYNTHROID, LEVOTHROID) 150 MCG tablet Take 150 mcg by mouth daily before breakfast.    . lisinopril-hydrochlorothiazide (PRINZIDE,ZESTORETIC) 20-12.5 MG per tablet Take 1 tablet by mouth daily.    Marland Kitchen oxyCODONE-acetaminophen (PERCOCET/ROXICET) 5-325 MG per tablet Take 1 tablet by mouth every 4 (four) hours as needed for severe pain (pain).    Marland Kitchen gabapentin  (NEURONTIN) 300 MG capsule Take 300 mg by mouth 3 (three) times daily.      No results found for this or any previous visit (from the past 48 hour(s)). No results found.  Review of Systems  Musculoskeletal: Positive for joint pain.       Left shoulder  All other systems reviewed and are negative.   Blood pressure 136/78, pulse 90, temperature 99.1 F (37.3 C), temperature source Oral, resp. rate 20, height 6\' 3"  (1.905 m), weight 116.1 kg (256 lb), SpO2 98 %. Physical Exam  Constitutional: He is oriented to person, place, and time. He appears well-developed and well-nourished.  HENT:  Head: Normocephalic and atraumatic.  Eyes: Pupils are equal, round, and reactive to light.  Neck: Normal range of motion.  Cardiovascular: Normal rate and regular rhythm.   Respiratory: Effort normal.  GI: Soft.  Musculoskeletal:  Left shoulder motion is good below the horizon. He has continued thin slightly cloudy fluid draining from the anterior portal. This still has no odor. There are no red streaks.   Neurological: He is alert and oriented to person, place, and time.  Skin: Skin is warm and dry.  Psychiatric: He has a normal mood and affect. His behavior is normal. Judgment and thought content normal.     Assessment/Plan Assessment: Left shoulder drainage status post arthroscopy and cuff repair 08/20/16 and previous surgery 2010 and 2005  Plan: [name] [name] unfortunately has an emergent issue with the  left shoulder. He's been experiencing postoperative drainage despite 2 different antibiotics. The cultures have not grown anything. I'm concerned that he might have a propionibacter acnes infection and we really need to consider an emergent irrigation and debridement. At the time of the surgery we will take a close look at the repair and suture material. I plan to leave a drain for a few days and most likely to change his antibiotic to doxycycline orally postop. We will take good cultures at the time of the  surgery and make sure to alert the lab that they will need to hold these for at least 2 weeks.  Caroleena Paolini, Ginger OrganNDREW PAUL, PA-C 09/04/2016, 12:32 PM

## 2016-09-04 NOTE — Op Note (Signed)
#  [id number] 

## 2016-09-04 NOTE — Anesthesia Procedure Notes (Signed)
Procedure Name: Intubation Date/Time: 09/04/2016 1:48 PM Performed by: Zenia ResidesPAYNE, Keonte Daubenspeck D Pre-anesthesia Checklist: Patient identified, Emergency Drugs available, Suction available and Patient being monitored Patient Re-evaluated:Patient Re-evaluated prior to inductionOxygen Delivery Method: Circle system utilized Preoxygenation: Pre-oxygenation with 100% oxygen Intubation Type: IV induction Ventilation: Mask ventilation without difficulty Laryngoscope Size: Mac and 3 Grade View: Grade II Tube type: Oral Number of attempts: 1 Airway Equipment and Method: Stylet and Oral airway Placement Confirmation: ETT inserted through vocal cords under direct vision,  positive ETCO2 and breath sounds checked- equal and bilateral Secured at: 23 cm Tube secured with: Tape Dental Injury: Teeth and Oropharynx as per pre-operative assessment

## 2016-09-04 NOTE — Transfer of Care (Signed)
Immediate Anesthesia Transfer of Care Note  Patient: [name] [name]  Procedure(s) Performed: Procedure(s): IRRIGATION AND DEBRIDEMENT SHOULDER (Left)  Patient Location: PACU  Anesthesia Type:General  Level of Consciousness: awake, alert  and oriented  Airway & Oxygen Therapy: Patient Spontanous Breathing and Patient connected to face mask oxygen  Post-op Assessment: Report given to RN and Post -op Vital signs reviewed and stable  Post vital signs: Reviewed and stable  Last Vitals:  Vitals:   09/04/16 1132  BP: 136/78  Pulse: 90  Resp: 20  Temp: 37.3 C    Last Pain:  Vitals:   09/04/16 1132  TempSrc: Oral  PainSc: 3          Complications: No apparent anesthesia complications

## 2016-09-04 NOTE — Discharge Instructions (Signed)

## 2016-09-04 NOTE — Anesthesia Preprocedure Evaluation (Addendum)
Anesthesia Evaluation  Patient identified by MRN, date of birth, ID band Patient awake    Reviewed: Allergy & Precautions, NPO status   Airway Mallampati: II       Dental   Pulmonary sleep apnea , former smoker,    breath sounds clear to auscultation       Cardiovascular hypertension,  Rhythm:Regular Rate:Normal     Neuro/Psych    GI/Hepatic negative GI ROS, Neg liver ROS,   Endo/Other  negative endocrine ROS  Renal/GU negative Renal ROS     Musculoskeletal   Abdominal   Peds  Hematology   Anesthesia Other Findings   Reproductive/Obstetrics                            Anesthesia Physical Anesthesia Plan  ASA: III  Anesthesia Plan: General   Post-op Pain Management:    Induction: Intravenous  Airway Management Planned: Oral ETT  Additional Equipment:   Intra-op Plan:   Post-operative Plan:   Informed Consent:   Dental advisory given  Plan Discussed with: CRNA and Anesthesiologist  Anesthesia Plan Comments:         Anesthesia Quick Evaluation

## 2016-09-07 NOTE — Op Note (Signed)
NAMEloisa Northern:  Klemz, Dragon             ACCOUNT NO.:  [id number]  MEDICAL RECORD NO.:  [id number]  LOCATION:                                 FACILITY:  PHYSICIAN:  Lubertha Basqueeter G. Erol Flanagin, M.D.DATE OF BIRTH:  09/04/2017  DATE OF PROCEDURE:  09/04/2016 DATE OF DISCHARGE:                              OPERATIVE REPORT   PREOPERATIVE DIAGNOSIS:  Light shoulder possible infection.  POSTOPERATIVE DIAGNOSIS:  Light shoulder possible infection.  PROCEDURE:  Left shoulder arthroscopic debridement.  ANESTHESIA:  General.  ATTENDING SURGEON:  Lubertha BasquePeter G. Jerl Santosalldorf, M.D.  ASSISTANT:  Elodia FlorenceAndrew Nida, PA  INDICATION FOR PROCEDURE:  The patient is a 55 year old man 2 weeks from a left shoulder arthroscopic rotator cuff repair.  He began experiencing some drainage about a week from the procedure, this is persisted despite various oral antibiotics.  He is draining copious amounts of slightly cloudy thin fluid.  This has been cultured with no growth and negative Gram stains.  At this point, he was presumed to have an infection which is as of yet no growth versus potentially reaction to the suture material used to repair his rotator cuff.  Nevertheless with this copious drainage, he is offered a trip to the operating room for arthroscopic debridement.  Informed operative consent was obtained after discussion of possible complications including reaction to anesthesia and infection.  SUMMARY OF FINDINGS AND PROCEDURE:  Under general anesthesia, through several of his old portals a left shoulder arthroscopy was performed. Glenohumeral joint showed no degenerative change.  The rotator cuff repair seemed intact from below though a bit loose.  In the subacromial space, this was also true.  The sutures appeared intact in the cuff with some laxity of the repair.  The tissues appeared fairly devitalized. The subacromial space was filled with some erythematous tissue.  This was biopsied and sent to the lab.  I also  obtained a culture of fluid from the shoulder at the start of the case.  After some significant thought, we elected to debride this rotator cuff tear and I removed all of the suture material.  I then debrided the rotator cuff back towards the glenoid.  The shoulder was irrigated with 9 L of saline, followed by 500 mL of antibiotic-containing solution.  We then closed loosely over some Penrose drains which were left in place.  He was scheduled to go home the same day.  DESCRIPTION OF PROCEDURE:  The patient was taken to the operating suite, where general anesthetic was applied without difficulty.  He was positioned in a beach-chair position and prepped and draped in normal sterile fashion.  After administration of preop IV vancomycin and appropriate time out, an arthroscopy of the left shoulder was performed through several old portals.  Findings were as noted above and procedure consisted of cultures of fluid and soft tissue followed by a thorough irrigation and debridement and removal of all suture material.  The cuff was debrided back towards the glenoid.  The shoulder was irrigated again with a total of 9 L of saline and 500 mL of antibiotic-containing solution.  We did place 1 Penrose drain in the glenohumeral joint and one in the subacromial space.  I used a  nylon to reapproximate the portals loosely.  Adaptic was applied followed by dry gauze and ABD pads and tape.  Estimated blood loss and fluids, obtain from anesthesia records.  DISPOSITION:  The patient was extubated in the operating room and scheduled go home same day.  I will contact him by phone tonight.     Lubertha Basque Jerl Santos, M.D.   ______________________________ Lubertha Basque. Jerl Santos, M.D.    PGD/MEDQ  D:  09/04/2016  T:  09/05/2016  Job:  [id number]

## 2016-09-08 ENCOUNTER — Encounter (HOSPITAL_BASED_OUTPATIENT_CLINIC_OR_DEPARTMENT_OTHER): Payer: Self-pay | Admitting: Orthopaedic Surgery

## 2016-09-08 LAB — BODY FLUID CULTURE: Culture: NO GROWTH

## 2016-09-09 LAB — AEROBIC/ANAEROBIC CULTURE (SURGICAL/DEEP WOUND): CULTURE: NO GROWTH

## 2016-09-09 LAB — AEROBIC/ANAEROBIC CULTURE W GRAM STAIN (SURGICAL/DEEP WOUND)
Culture: NO GROWTH
Gram Stain: NONE SEEN

## 2016-09-11 NOTE — Anesthesia Postprocedure Evaluation (Signed)
Anesthesia Post Note  Patient: [name] [name]  Procedure(s) Performed: Procedure(s) (LRB): IRRIGATION AND DEBRIDEMENT SHOULDER (Left)  Patient location during evaluation: PACU Anesthesia Type: General Level of consciousness: awake Pain management: pain level controlled Respiratory status: spontaneous breathing Cardiovascular status: stable Anesthetic complications: no    Last Vitals:  Vitals:   09/04/16 1615 09/04/16 1639  BP: (!) 144/89 (!) 148/87  Pulse:  91  Resp:  18  Temp:  36.8 C    Last Pain:  Vitals:   09/07/16 1048  TempSrc:   PainSc: 2                  EDWARDS,Neta Upadhyay

## 2016-09-18 LAB — ANAEROBIC CULTURE

## 2017-08-10 ENCOUNTER — Other Ambulatory Visit: Payer: Self-pay | Admitting: Orthopedic Surgery

## 2017-08-10 DIAGNOSIS — M5416 Radiculopathy, lumbar region: Secondary | ICD-10-CM

## 2017-08-18 ENCOUNTER — Other Ambulatory Visit: Payer: Self-pay

## 2017-08-23 ENCOUNTER — Other Ambulatory Visit: Payer: Self-pay

## 2017-09-16 ENCOUNTER — Other Ambulatory Visit: Payer: Self-pay | Admitting: Orthopedic Surgery

## 2017-10-04 ENCOUNTER — Telehealth: Payer: Self-pay | Admitting: Vascular Surgery

## 2017-10-04 NOTE — Telephone Encounter (Signed)
-----   Message from Retta Macebecca J Roberts, RN sent at 10/04/2017 11:24 AM EDT ----- Regarding: appointment needed Please sched an appointment for this patient to see Dr. Arbie CookeyEarly (the 10/30 looks to be the best bet) He is for ALIF surgery on the 10/20/17. Please call and give this appt time to this patient. Thank you Devon EnergyBecky

## 2017-10-04 NOTE — Telephone Encounter (Signed)
Sched appt 10/12/17 at 4:00. Lm on hm#.

## 2017-10-05 ENCOUNTER — Other Ambulatory Visit: Payer: Self-pay | Admitting: *Deleted

## 2017-10-11 NOTE — Pre-Procedure Instructions (Addendum)
[name] [name]  10/11/2017      Your procedure is scheduled on Wednesday, November 7.  Report to [facility] Admitting at 8:45 AM                     Your surgery or procedure is scheduled for 11:45A.M.   Call this number if you have problems the morning of surgery:[phone]- pre- op desk               For any other questions, please call [phone], Monday - Friday 8 AM - 4 PM.    Remember:  Do not eat food or drink liquids after midnight Tuesday. November 6.  Take these medicines the morning of surgery with A SIP OF WATER: atorvastatin (LIPITOR) levothyroxine (SYNTHROID, LEVOTHROID) Montelukast Alprazolam Bupropion   1 week prior to surgery: DO NOT Take Aspirin, Aspirin Products (Goody Powder, Excedrin Migraine), Ibuprofen (Advil), Naproxen (Aleve),Celebrax, Vitamins and Herbal Products (ie Fish Oil)  Special instructions:   [place]- Preparing For Surgery  Before surgery, you can play an important role. Because skin is not sterile, your skin needs to be as free of germs as possible. You can reduce the number of germs on your skin by washing with CHG (chlorahexidine gluconate) Soap before surgery.  CHG is an antiseptic cleaner which kills germs and bonds with the skin to continue killing germs even after washing.  Please do not use if you have an allergy to CHG or antibacterial soaps. If your skin becomes reddened/irritated stop using the CHG.  Do not shave (including legs and underarms) for at least 48 hours prior to first CHG shower. It is OK to shave your face.  Please follow these instructions carefully.   1. Shower the NIGHT BEFORE SURGERY and the MORNING OF SURGERY with CHG.   2. If you chose to wash your hair, wash your hair first as usual with your normal shampoo.  3. After you shampoo, rinse your hair and body thoroughly to remove the shampoo.  4. Use CHG as you would any other liquid soap. You can apply CHG directly to the skin and wash  gently with a scrungie or a clean washcloth.   5. Apply the CHG Soap to your body ONLY FROM THE NECK DOWN.  Do not use on open wounds or open sores. Avoid contact with your eyes, ears, mouth and genitals (private parts). Wash Face and genitals (private parts)  with your normal soap.  6. Wash thoroughly, paying special attention to the area where your surgery will be performed.  7. Thoroughly rinse your body with warm water from the neck down.  8. DO NOT shower/wash with your normal soap after using and rinsing off the CHG Soap.  9. Pat yourself dry with a CLEAN TOWEL.  10. Wear CLEAN PAJAMAS to bed the night before surgery, wear comfortable clothes the morning of surgery  11. Place CLEAN SHEETS on your bed the night of your first shower and DO NOT SLEEP WITH PETS.  Day of Surgery:  Shower as Above Do not apply any deodorants/lotions, powders and colognes. Please wear clean clothes to the hospital/surgery center.    Do not wear jewelry, make-up or nail polish.  Do not shave 48 hours prior to surgery.  Men may shave face and neck.  Do not bring valuables to the hospital.  [facility] is not responsible for any belongings or valuables.  Contacts, dentures or bridgework may not be worn into  surgery.  Leave your suitcase in the car.  After surgery it may be brought to your room.  For patients admitted to the hospital, discharge time will be determined by your treatment team.  Please read over the following fact sheets that you were given: Pain Booklet, Patient Instructions for Mupirocin Application, Incentive Spirometry, Surgical Site Infections.

## 2017-10-12 ENCOUNTER — Encounter (HOSPITAL_COMMUNITY): Payer: Self-pay

## 2017-10-12 ENCOUNTER — Encounter (HOSPITAL_COMMUNITY)
Admission: RE | Admit: 2017-10-12 | Discharge: 2017-10-12 | Disposition: A | Discharge status: Home / Self Care | Payer: 59 | Source: Ambulatory Visit | Attending: Orthopedic Surgery | Admitting: Orthopedic Surgery

## 2017-10-12 ENCOUNTER — Ambulatory Visit (INDEPENDENT_AMBULATORY_CARE_PROVIDER_SITE_OTHER): Payer: 59 | Admitting: Vascular Surgery

## 2017-10-12 ENCOUNTER — Encounter: Payer: Self-pay | Admitting: Vascular Surgery

## 2017-10-12 VITALS — BP 125/83 | HR 91 | Temp 99.1°F | Resp 20 | Ht 75.5 in | Wt 240.0 lb

## 2017-10-12 DIAGNOSIS — M48062 Spinal stenosis, lumbar region with neurogenic claudication: Secondary | ICD-10-CM | POA: Diagnosis not present

## 2017-10-12 DIAGNOSIS — Z01818 Encounter for other preprocedural examination: Secondary | ICD-10-CM | POA: Diagnosis present

## 2017-10-12 HISTORY — DX: Polyneuropathy, unspecified: G62.9

## 2017-10-12 HISTORY — DX: Hypothyroidism, unspecified: E03.9

## 2017-10-12 HISTORY — DX: Anxiety disorder, unspecified: F41.9

## 2017-10-12 LAB — URINALYSIS, ROUTINE W REFLEX MICROSCOPIC
BILIRUBIN URINE: NEGATIVE
Glucose, UA: NEGATIVE mg/dL
HGB URINE DIPSTICK: NEGATIVE
KETONES UR: NEGATIVE mg/dL
Leukocytes, UA: NEGATIVE
Nitrite: NEGATIVE
Protein, ur: NEGATIVE mg/dL
Specific Gravity, Urine: 1.013 (ref 1.005–1.030)
pH: 5 (ref 5.0–8.0)

## 2017-10-12 LAB — CBC WITH DIFFERENTIAL/PLATELET
BASOS PCT: 0 %
Basophils Absolute: 0 10*3/uL (ref 0.0–0.1)
EOS PCT: 1 %
Eosinophils Absolute: 0.1 10*3/uL (ref 0.0–0.7)
HEMATOCRIT: 43.1 % (ref 39.0–52.0)
HEMOGLOBIN: 14.6 g/dL (ref 13.0–17.0)
LYMPHS ABS: 3.1 10*3/uL (ref 0.7–4.0)
LYMPHS PCT: 28 %
MCH: 31.5 pg (ref 26.0–34.0)
MCHC: 33.9 g/dL (ref 30.0–36.0)
MCV: 92.9 fL (ref 78.0–100.0)
MONOS PCT: 10 %
Monocytes Absolute: 1.1 10*3/uL — ABNORMAL HIGH (ref 0.1–1.0)
NEUTROS ABS: 6.8 10*3/uL (ref 1.7–7.7)
Neutrophils Relative %: 61 %
Platelets: 290 10*3/uL (ref 150–400)
RBC: 4.64 MIL/uL (ref 4.22–5.81)
RDW: 13.5 % (ref 11.5–15.5)
WBC: 11.1 10*3/uL — ABNORMAL HIGH (ref 4.0–10.5)

## 2017-10-12 LAB — COMPREHENSIVE METABOLIC PANEL
ALBUMIN: 4.3 g/dL (ref 3.5–5.0)
ALK PHOS: 69 U/L (ref 38–126)
ALT: 28 U/L (ref 17–63)
ANION GAP: 8 (ref 5–15)
AST: 27 U/L (ref 15–41)
BILIRUBIN TOTAL: 0.6 mg/dL (ref 0.3–1.2)
BUN: 27 mg/dL — ABNORMAL HIGH (ref 6–20)
CALCIUM: 9.3 mg/dL (ref 8.9–10.3)
CO2: 26 mmol/L (ref 22–32)
Chloride: 102 mmol/L (ref 101–111)
Creatinine, Ser: 1.38 mg/dL — ABNORMAL HIGH (ref 0.61–1.24)
GFR calc Af Amer: >60 mL/min (ref >60)
GFR, EST NON AFRICAN AMERICAN: 55 mL/min — AB (ref >60)
GLUCOSE: 100 mg/dL — AB (ref 65–99)
POTASSIUM: 4.2 mmol/L (ref 3.5–5.1)
Sodium: 136 mmol/L (ref 135–145)
TOTAL PROTEIN: 7.4 g/dL (ref 6.5–8.1)

## 2017-10-12 LAB — PROTIME-INR
INR: 1.06
Prothrombin Time: 13.7 seconds (ref 11.4–15.2)

## 2017-10-12 LAB — TYPE AND SCREEN
ABO/RH(D): O POS
ANTIBODY SCREEN: NEGATIVE

## 2017-10-12 LAB — SURGICAL PCR SCREEN
MRSA, PCR: NEGATIVE
Staphylococcus aureus: NEGATIVE

## 2017-10-12 LAB — APTT: APTT: 33 s (ref 24–36)

## 2017-10-12 NOTE — Progress Notes (Signed)
Referring Physician: Dr. 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  Plan for anterior lumbar exposure.  HPI: [name] [name] is a 56 y.o. male who has had lumbar pain with radicular pain into his left LE resulting in muscle waisting and interfering in his ADL's.   Other medical problems include: HTN managed with lisinopril, daily aspirin , and synthroid.  He denise DM and CAD.  He has no history of abdominal surgery.  Past Medical History:  Diagnosis Date  . Anxiety   . Hypertension   . Hypothyroidism   . Neuropathy     left foot  . Sleep apnea    Past Surgical History:  Procedure Laterality Date  . COLONOSCOPY    . IRRIGATION AND DEBRIDEMENT SHOULDER Left 09/04/2016   Procedure: IRRIGATION AND DEBRIDEMENT SHOULDER;  Surgeon: Marcene Corning, MD;  Location: [facility];  Service: Orthopedics;  Laterality: Left;  . KNEE ARTHROSCOPY Bilateral 5-10 years ago   both knees  . LUMBAR LAMINECTOMY/DECOMPRESSION MICRODISCECTOMY N/A 10/24/2014   Procedure: LUMBAR LAMINECTOMY/DECOMPRESSION MICRODISCECTOMY;  Surgeon: Emilee Hero, MD;  Location: [facility] OR;  Service: Orthopedics;  Laterality: N/A;  Lumbar 5-sacrum 1 decompression  . OPEN ANTERIOR SHOULDER RECONSTRUCTION Right 10 years ago  . SHOULDER ARTHROSCOPY  5-10 years ago   both shoulder  . VARICOSE VEIN SURGERY Bilateral 4 yrs ago     Family History  Problem Relation Age of Onset  . Diabetes Mother   . Diabetes Brother     SOCIAL HISTORY: Social History   Social History  . Marital status: Married    Spouse name: N/A  . Number of children: N/A  . Years of education: N/A   Occupational History  . Not on file.   Social History Main Topics  . Smoking status: Former Smoker    Types: Cigarettes    Quit date: 2003  . Smokeless tobacco: Never Used     Comment: was a  light smoker maybe 3 a week  . Alcohol use No  . Drug use: No  . Sexual activity: Not on file   Other Topics Concern  . Not on file   Social History Narrative  . No narrative on file    No Known Allergies  Current Outpatient Prescriptions  Medication Sig Dispense Refill  . ALPRAZolam (XANAX) 0.5 MG tablet Take 0.25 mg by mouth 2 (two) times daily as needed for sleep.     Marland Kitchen aspirin EC 81 MG tablet Take 81 mg by mouth daily.    Marland Kitchen atorvastatin (LIPITOR) 20 MG tablet Take 20 mg by mouth daily.    Marland Kitchen buPROPion (WELLBUTRIN XL) 300 MG 24 hr tablet Take 300 mg by mouth daily.    . celecoxib (CELEBREX) 200 MG capsule Take 200 mg by mouth daily.    . ciprofloxacin-hydrocortisone (CIPRO HC OTIC) OTIC suspension Place 3 drops into both ears 2 (two) times daily.    . clindamycin (CLINDAGEL) 1 % gel Apply 1 application topically 2 (two) times daily.    . cycloSPORINE (RESTASIS) 0.05 % ophthalmic emulsion Place 1 drop into both eyes 2 (two) times daily.    . diazepam (VALIUM) 5 MG tablet Take 5 mg by mouth every 6 (six) hours as needed for muscle spasms.    . diclofenac sodium (VOLTAREN) 1 % GEL Apply 2 g topically 4 (four) times daily.    Marland Kitchen  levothyroxine (SYNTHROID, LEVOTHROID) 200 MCG tablet Take 200 mcg by mouth daily before breakfast.    . lisinopril-hydrochlorothiazide (PRINZIDE,ZESTORETIC) 20-12.5 MG per tablet Take 1 tablet by mouth daily.    . meloxicam (MOBIC) 15 MG tablet Take 15 mg by mouth daily.    . montelukast (SINGULAIR) 10 MG tablet Take 10 mg by mouth daily.    Marland Kitchen oxyCODONE-acetaminophen (PERCOCET/ROXICET) 5-325 MG tablet Take 1 tablet by mouth every 6 (six) hours as needed for severe pain.    . pregabalin (LYRICA) 300 MG capsule Take 300 mg by mouth 2 (two) times daily as needed (pain).    . traMADol (ULTRAM) 50 MG tablet Take 50 mg by mouth every 6 (six) hours as needed for moderate pain.     No current facility-administered medications for this visit.     ROS:   General:   No weight loss, Fever, chills  HEENT: No recent headaches, no nasal bleeding, no visual changes, no sore throat  Neurologic: No dizziness, blackouts, seizures. No recent symptoms of stroke or mini- stroke. No recent episodes of slurred speech, or temporary blindness.  Cardiac: No recent episodes of chest pain/pressure, no shortness of breath at rest.  No shortness of breath with exertion.  Denies history of atrial fibrillation or irregular heartbeat  Vascular: No history of rest pain in feet.  No history of claudication.  No history of non-healing ulcer, No history of DVT   Pulmonary: No home oxygen, no productive cough, no hemoptysis,  No asthma or wheezing  Musculoskeletal:  [ ]  Arthritis, [ x] Low back pain,  [ ]  Joint pain  Hematologic:No history of hypercoagulable state.  No history of easy bleeding.  No history of anemia  Gastrointestinal: No hematochezia or melena,  No gastroesophageal reflux, no trouble swallowing  Urinary: [ ]  chronic Kidney disease, [ ]  on HD - [ ]  MWF or [ ]  TTHS, [ ]  Burning with urination, [ ]  Frequent urination, [ ]  Difficulty urinating;   Skin: No rashes  Psychological: No history of anxiety,  No history of depression   Physical Examination  Vitals:   10/12/17 1551  BP: 125/83  Pulse: 91  Resp: 20  Temp: 99.1 F (37.3 C)  TempSrc: Oral  SpO2: 95%  Weight: 240 lb (108.9 kg)  Height: 6' 3.5" (1.918 m)    Body mass index is 29.6 kg/m.  General:  Alert and oriented, no acute distress HEENT: Normal Neck: No bruit or JVD Pulmonary: Clear to auscultation bilaterally Cardiac: Regular Rate and Rhythm without murmur Abdomen: Soft, non-tender, non-distended, no mass, no scars Skin: No rash Extremity Pulses:  2+ radial, brachial, femoral, dorsalis pedis, posterior tibial pulses bilaterally Musculoskeletal: No deformity or edema, muscle waisting left thigh and calf  Neurologic: Upper and lower extremity motor 5/5 and symmetric  DATA:  MRI Left  L5-S1 neuro foraminal stenosis.  ASSESSMENT:   Left L5-S1 neuro foraminal stenosis.  PLAN:   10/20/2017 anterior lumbar spine exposure for anterior lumbar fusion.  Benefits and risks were discussed with Dr. Arbie Cookey today.  F/U PRN.   Jerry Haugen MAUREEN PA-C [facility]   The patient was seen in conjunction with Dr. Arbie Cookey today..  I have examined the patient, reviewed and agree with above.  Discussed my role in for his L5-S1 fusion.  Rectus muscle, intraperitoneal contents, left ureter and arterial venous structures overlying the spine and potential injury to all of these.  I do not see any contraindications to proceeding with planned  surgery.  All questions were answered.  Gretta BeganEarly, Todd, MD 10/12/2017 5:09 PM

## 2017-10-12 NOTE — Progress Notes (Signed)
Mr. [name] [name] denies chest pain or shortness of breath.  Mr [name] [name] reports that he goes to the gym everyday.  [facility]P [place] [facility] [place]

## 2017-10-14 NOTE — Progress Notes (Signed)
Anesthesia Chart Review:  Pt is a 56 year old male scheduled for L5-S1 ALIF on 10/20/2017 with Estill BambergMark Dumonski, MD and Gretta Beganodd Early, MD  - PCP is [facility] [place], [place] (notes in care everywhere)   PMH includes:  HTN, OSA, hypothyroidism. Former smoker. BMI 29.5. S/p irrigation and debridement L shoulder 09/04/16. S/p lumbar laminectomy 10/24/14.   Medications include: ASA 81mg , Lipitor, levothyroxine, lisinopril-HCTZ  BP 131/80   Pulse 99   Temp 36.7 C   Resp 20   Ht 6' 3.5" (1.918 m)   Wt 240 lb 14.4 oz (109.3 kg)   SpO2 96%   BMI 29.71 kg/m   Preoperative labs reviewed.   - Cr 1.38, BUN 27.  Baseline Cr ~1 (see care everywhere). Will recheck renal function day of surgery.   CXR 10/13/17: Negative.  No acute cardiopulmonary abnormality.  EKG 10/12/17: NSR  If no changes, I anticipate pt can proceed with surgery as scheduled.   Rica Mastngela Kerrington Greenhalgh, FNP-BC [facility]/Anesthesiology Phone: [phone] 10/14/2017 11:00 AM

## 2017-10-20 ENCOUNTER — Inpatient Hospital Stay (HOSPITAL_COMMUNITY)
Admission: RE | Admit: 2017-10-20 | Discharge: 2017-10-22 | DRG: 455 | Disposition: A | Discharge status: Home / Self Care | Payer: 59 | Attending: Orthopedic Surgery | Admitting: Orthopedic Surgery

## 2017-10-20 ENCOUNTER — Inpatient Hospital Stay (HOSPITAL_COMMUNITY): Payer: 59

## 2017-10-20 ENCOUNTER — Encounter (HOSPITAL_COMMUNITY): Payer: Self-pay | Admitting: *Deleted

## 2017-10-20 ENCOUNTER — Inpatient Hospital Stay (HOSPITAL_COMMUNITY): Payer: 59 | Admitting: Emergency Medicine

## 2017-10-20 ENCOUNTER — Encounter (HOSPITAL_COMMUNITY)
Admission: RE | Disposition: A | Discharge status: Home / Self Care | Payer: Self-pay | Source: Home / Self Care | Attending: Orthopedic Surgery

## 2017-10-20 DIAGNOSIS — I10 Essential (primary) hypertension: Secondary | ICD-10-CM | POA: Diagnosis present

## 2017-10-20 DIAGNOSIS — M48061 Spinal stenosis, lumbar region without neurogenic claudication: Principal | ICD-10-CM | POA: Diagnosis present

## 2017-10-20 DIAGNOSIS — M2578 Osteophyte, vertebrae: Secondary | ICD-10-CM | POA: Diagnosis present

## 2017-10-20 DIAGNOSIS — M79605 Pain in left leg: Secondary | ICD-10-CM | POA: Diagnosis present

## 2017-10-20 DIAGNOSIS — Z7982 Long term (current) use of aspirin: Secondary | ICD-10-CM

## 2017-10-20 DIAGNOSIS — M5137 Other intervertebral disc degeneration, lumbosacral region: Secondary | ICD-10-CM

## 2017-10-20 DIAGNOSIS — E039 Hypothyroidism, unspecified: Secondary | ICD-10-CM | POA: Diagnosis present

## 2017-10-20 DIAGNOSIS — M5416 Radiculopathy, lumbar region: Secondary | ICD-10-CM

## 2017-10-20 DIAGNOSIS — Z981 Arthrodesis status: Secondary | ICD-10-CM | POA: Diagnosis not present

## 2017-10-20 DIAGNOSIS — Z79890 Hormone replacement therapy (postmenopausal): Secondary | ICD-10-CM

## 2017-10-20 DIAGNOSIS — Z79899 Other long term (current) drug therapy: Secondary | ICD-10-CM

## 2017-10-20 DIAGNOSIS — Z87891 Personal history of nicotine dependence: Secondary | ICD-10-CM

## 2017-10-20 DIAGNOSIS — G629 Polyneuropathy, unspecified: Secondary | ICD-10-CM | POA: Diagnosis present

## 2017-10-20 DIAGNOSIS — F419 Anxiety disorder, unspecified: Secondary | ICD-10-CM | POA: Diagnosis present

## 2017-10-20 DIAGNOSIS — M5410 Radiculopathy, site unspecified: Secondary | ICD-10-CM | POA: Diagnosis present

## 2017-10-20 DIAGNOSIS — M5117 Intervertebral disc disorders with radiculopathy, lumbosacral region: Secondary | ICD-10-CM | POA: Diagnosis present

## 2017-10-20 DIAGNOSIS — G4730 Sleep apnea, unspecified: Secondary | ICD-10-CM | POA: Diagnosis present

## 2017-10-20 DIAGNOSIS — Z419 Encounter for procedure for purposes other than remedying health state, unspecified: Secondary | ICD-10-CM

## 2017-10-20 HISTORY — PX: ABDOMINAL EXPOSURE: SHX5708

## 2017-10-20 HISTORY — PX: ANTERIOR LUMBAR FUSION: SHX1170

## 2017-10-20 LAB — BASIC METABOLIC PANEL
Anion gap: 5 (ref 5–15)
BUN: 16 mg/dL (ref 6–20)
CHLORIDE: 108 mmol/L (ref 101–111)
CO2: 24 mmol/L (ref 22–32)
Calcium: 9.1 mg/dL (ref 8.9–10.3)
Creatinine, Ser: 0.82 mg/dL (ref 0.61–1.24)
GFR calc Af Amer: >60 mL/min (ref >60)
GFR calc non Af Amer: >60 mL/min (ref >60)
GLUCOSE: 93 mg/dL (ref 65–99)
POTASSIUM: 4.2 mmol/L (ref 3.5–5.1)
Sodium: 137 mmol/L (ref 135–145)

## 2017-10-20 SURGERY — ANTERIOR LUMBAR FUSION 1 LEVEL
Anesthesia: General | Site: Spine Lumbar

## 2017-10-20 MED ORDER — PHENYLEPHRINE 40 MCG/ML (10ML) SYRINGE FOR IV PUSH (FOR BLOOD PRESSURE SUPPORT)
PREFILLED_SYRINGE | INTRAVENOUS | Status: AC
  Filled 2017-10-20: qty 10

## 2017-10-20 MED ORDER — ROCURONIUM BROMIDE 10 MG/ML (PF) SYRINGE
PREFILLED_SYRINGE | INTRAVENOUS | Status: AC
  Filled 2017-10-20: qty 5

## 2017-10-20 MED ORDER — PANTOPRAZOLE SODIUM 40 MG IV SOLR
40.0000 mg | Freq: Every day | INTRAVENOUS | Status: DC
  Administered 2017-10-20: 40 mg via INTRAVENOUS
  Filled 2017-10-20: qty 40

## 2017-10-20 MED ORDER — CIPROFLOXACIN-HYDROCORTISONE 0.2-1 % OT SUSP
3.0000 [drp] | Freq: Two times a day (BID) | OTIC | Status: DC
  Filled 2017-10-20: qty 10

## 2017-10-20 MED ORDER — SENNOSIDES-DOCUSATE SODIUM 8.6-50 MG PO TABS: 1.0000 | ORAL_TABLET | Freq: Every evening | ORAL | Status: DC | PRN

## 2017-10-20 MED ORDER — SODIUM CHLORIDE 0.9% FLUSH
3.0000 mL | Freq: Two times a day (BID) | INTRAVENOUS | Status: DC
  Administered 2017-10-21: 3 mL via INTRAVENOUS

## 2017-10-20 MED ORDER — LISINOPRIL 20 MG PO TABS
20.0000 mg | ORAL_TABLET | Freq: Every day | ORAL | Status: DC
  Administered 2017-10-22: 20 mg via ORAL
  Filled 2017-10-20: qty 1

## 2017-10-20 MED ORDER — POVIDONE-IODINE 7.5 % EX SOLN: Freq: Once | CUTANEOUS | Status: DC

## 2017-10-20 MED ORDER — HYDROCHLOROTHIAZIDE 12.5 MG PO CAPS
12.5000 mg | ORAL_CAPSULE | Freq: Every day | ORAL | Status: DC
  Administered 2017-10-22: 12.5 mg via ORAL
  Filled 2017-10-20: qty 1

## 2017-10-20 MED ORDER — BUPROPION HCL ER (XL) 150 MG PO TB24
300.0000 mg | ORAL_TABLET | Freq: Every day | ORAL | Status: DC
  Administered 2017-10-21 – 2017-10-22 (×2): 300 mg via ORAL
  Filled 2017-10-20 (×2): qty 2

## 2017-10-20 MED ORDER — MONTELUKAST SODIUM 10 MG PO TABS
10.0000 mg | ORAL_TABLET | Freq: Every day | ORAL | Status: DC
  Administered 2017-10-21 – 2017-10-22 (×2): 10 mg via ORAL
  Filled 2017-10-20 (×2): qty 1

## 2017-10-20 MED ORDER — EPHEDRINE SULFATE 50 MG/ML IJ SOLN
INTRAMUSCULAR | Status: DC | PRN
  Administered 2017-10-20: 10 mg via INTRAVENOUS

## 2017-10-20 MED ORDER — CEFAZOLIN SODIUM-DEXTROSE 2-4 GM/100ML-% IV SOLN
2.0000 g | INTRAVENOUS | Status: AC
  Administered 2017-10-21: 2 g via INTRAVENOUS
  Filled 2017-10-20: qty 100

## 2017-10-20 MED ORDER — ONDANSETRON HCL 4 MG PO TABS: 4.0000 mg | ORAL_TABLET | Freq: Four times a day (QID) | ORAL | Status: DC | PRN

## 2017-10-20 MED ORDER — LIDOCAINE 2% (20 MG/ML) 5 ML SYRINGE
INTRAMUSCULAR | Status: DC | PRN
  Administered 2017-10-20: 100 mg via INTRAVENOUS

## 2017-10-20 MED ORDER — HYDROMORPHONE HCL 1 MG/ML IJ SOLN
INTRAMUSCULAR | Status: AC
  Filled 2017-10-20: qty 1

## 2017-10-20 MED ORDER — FENTANYL CITRATE (PF) 250 MCG/5ML IJ SOLN
INTRAMUSCULAR | Status: AC
  Filled 2017-10-20: qty 5

## 2017-10-20 MED ORDER — FLEET ENEMA 7-19 GM/118ML RE ENEM: 1.0000 | ENEMA | Freq: Once | RECTAL | Status: DC | PRN

## 2017-10-20 MED ORDER — DIAZEPAM 5 MG PO TABS
5.0000 mg | ORAL_TABLET | Freq: Four times a day (QID) | ORAL | Status: DC | PRN
  Administered 2017-10-21: 5 mg via ORAL

## 2017-10-20 MED ORDER — SUGAMMADEX SODIUM 200 MG/2ML IV SOLN
INTRAVENOUS | Status: AC
  Filled 2017-10-20: qty 2

## 2017-10-20 MED ORDER — MIDAZOLAM HCL 2 MG/2ML IJ SOLN
INTRAMUSCULAR | Status: AC
  Filled 2017-10-20: qty 2

## 2017-10-20 MED ORDER — PHENOL 1.4 % MT LIQD: 1.0000 | OROMUCOSAL | Status: DC | PRN

## 2017-10-20 MED ORDER — ONDANSETRON HCL 4 MG/2ML IJ SOLN: 4.0000 mg | Freq: Four times a day (QID) | INTRAMUSCULAR | Status: DC | PRN

## 2017-10-20 MED ORDER — PROPOFOL 10 MG/ML IV BOLUS
INTRAVENOUS | Status: DC | PRN
  Administered 2017-10-20: 200 mg via INTRAVENOUS

## 2017-10-20 MED ORDER — ALPRAZOLAM 0.25 MG PO TABS
0.2500 mg | ORAL_TABLET | Freq: Two times a day (BID) | ORAL | Status: DC | PRN
  Administered 2017-10-21 (×2): 0.25 mg via ORAL
  Filled 2017-10-20 (×2): qty 1

## 2017-10-20 MED ORDER — CYCLOSPORINE 0.05 % OP EMUL
1.0000 [drp] | Freq: Two times a day (BID) | OPHTHALMIC | Status: DC
  Administered 2017-10-20 – 2017-10-21 (×2): 1 [drp] via OPHTHALMIC
  Filled 2017-10-20 (×4): qty 1

## 2017-10-20 MED ORDER — CHLORHEXIDINE GLUCONATE 4 % EX LIQD: 60.0000 mL | Freq: Once | CUTANEOUS | Status: DC

## 2017-10-20 MED ORDER — OXYCODONE-ACETAMINOPHEN 5-325 MG PO TABS
1.0000 | ORAL_TABLET | ORAL | Status: DC | PRN
  Administered 2017-10-20 – 2017-10-22 (×5): 2 via ORAL
  Filled 2017-10-20 (×4): qty 2

## 2017-10-20 MED ORDER — ONDANSETRON HCL 4 MG/2ML IJ SOLN
INTRAMUSCULAR | Status: DC | PRN
  Administered 2017-10-20: 4 mg via INTRAVENOUS

## 2017-10-20 MED ORDER — LISINOPRIL-HYDROCHLOROTHIAZIDE 20-12.5 MG PO TABS: 1.0000 | ORAL_TABLET | Freq: Every day | ORAL | Status: DC

## 2017-10-20 MED ORDER — MENTHOL 3 MG MT LOZG: 1.0000 | LOZENGE | OROMUCOSAL | Status: DC | PRN

## 2017-10-20 MED ORDER — THROMBIN 5000 UNITS EX SOLR
CUTANEOUS | Status: DC | PRN
  Administered 2017-10-20: 5000 [IU] via TOPICAL

## 2017-10-20 MED ORDER — ONDANSETRON HCL 4 MG/2ML IJ SOLN
INTRAMUSCULAR | Status: AC
  Filled 2017-10-20: qty 2

## 2017-10-20 MED ORDER — SUGAMMADEX SODIUM 200 MG/2ML IV SOLN
INTRAVENOUS | Status: DC | PRN
  Administered 2017-10-20: 200 mg via INTRAVENOUS

## 2017-10-20 MED ORDER — BISACODYL 5 MG PO TBEC: 5.0000 mg | DELAYED_RELEASE_TABLET | Freq: Every day | ORAL | Status: DC | PRN

## 2017-10-20 MED ORDER — OXYCODONE HCL 5 MG PO TABS: 5.0000 mg | ORAL_TABLET | Freq: Once | ORAL | Status: DC | PRN

## 2017-10-20 MED ORDER — CEFAZOLIN SODIUM-DEXTROSE 2-4 GM/100ML-% IV SOLN
2.0000 g | INTRAVENOUS | Status: AC
  Administered 2017-10-20: 2 g via INTRAVENOUS
  Filled 2017-10-20: qty 100

## 2017-10-20 MED ORDER — FENTANYL CITRATE (PF) 100 MCG/2ML IJ SOLN
INTRAMUSCULAR | Status: AC
  Filled 2017-10-20: qty 2

## 2017-10-20 MED ORDER — MIDAZOLAM HCL 2 MG/2ML IJ SOLN
INTRAMUSCULAR | Status: DC | PRN
  Administered 2017-10-20: 2 mg via INTRAVENOUS

## 2017-10-20 MED ORDER — LIDOCAINE 2% (20 MG/ML) 5 ML SYRINGE
INTRAMUSCULAR | Status: AC
  Filled 2017-10-20: qty 5

## 2017-10-20 MED ORDER — BUPIVACAINE-EPINEPHRINE 0.25% -1:200000 IJ SOLN
INTRAMUSCULAR | Status: DC | PRN
  Administered 2017-10-20: 10 mL

## 2017-10-20 MED ORDER — DEXAMETHASONE SODIUM PHOSPHATE 10 MG/ML IJ SOLN
INTRAMUSCULAR | Status: AC
  Filled 2017-10-20: qty 1

## 2017-10-20 MED ORDER — SODIUM CHLORIDE 0.9% FLUSH
3.0000 mL | INTRAVENOUS | Status: DC | PRN
Start: 2017-10-20 — End: 2017-10-22

## 2017-10-20 MED ORDER — ACETAMINOPHEN 650 MG RE SUPP: 650.0000 mg | RECTAL | Status: DC | PRN

## 2017-10-20 MED ORDER — DOCUSATE SODIUM 100 MG PO CAPS
100.0000 mg | ORAL_CAPSULE | Freq: Two times a day (BID) | ORAL | Status: DC
  Administered 2017-10-20 – 2017-10-22 (×3): 100 mg via ORAL
  Filled 2017-10-20 (×3): qty 1

## 2017-10-20 MED ORDER — LACTATED RINGERS IV SOLN
INTRAVENOUS | Status: DC
  Administered 2017-10-20 (×3): via INTRAVENOUS

## 2017-10-20 MED ORDER — POTASSIUM CHLORIDE IN NACL 20-0.9 MEQ/L-% IV SOLN
INTRAVENOUS | Status: DC
  Administered 2017-10-20: 20:00:00 via INTRAVENOUS
  Filled 2017-10-20: qty 1000

## 2017-10-20 MED ORDER — LEVOTHYROXINE SODIUM 100 MCG PO TABS
200.0000 ug | ORAL_TABLET | Freq: Every day | ORAL | Status: DC
  Administered 2017-10-21 – 2017-10-22 (×2): 200 ug via ORAL
  Filled 2017-10-20 (×2): qty 2

## 2017-10-20 MED ORDER — PHENYLEPHRINE 40 MCG/ML (10ML) SYRINGE FOR IV PUSH (FOR BLOOD PRESSURE SUPPORT)
PREFILLED_SYRINGE | INTRAVENOUS | Status: DC | PRN
  Administered 2017-10-20 (×2): 80 ug via INTRAVENOUS

## 2017-10-20 MED ORDER — BUPIVACAINE-EPINEPHRINE (PF) 0.25% -1:200000 IJ SOLN
INTRAMUSCULAR | Status: AC
  Filled 2017-10-20: qty 30

## 2017-10-20 MED ORDER — 0.9 % SODIUM CHLORIDE (POUR BTL) OPTIME
TOPICAL | Status: DC | PRN
  Administered 2017-10-20: 1000 mL

## 2017-10-20 MED ORDER — POVIDONE-IODINE 7.5 % EX SOLN
Freq: Once | CUTANEOUS | Status: DC
  Filled 2017-10-20 (×2): qty 118

## 2017-10-20 MED ORDER — CEFAZOLIN SODIUM-DEXTROSE 2-4 GM/100ML-% IV SOLN
2.0000 g | Freq: Three times a day (TID) | INTRAVENOUS | Status: AC
  Administered 2017-10-20 – 2017-10-21 (×2): 2 g via INTRAVENOUS
  Filled 2017-10-20 (×2): qty 100

## 2017-10-20 MED ORDER — PREGABALIN 75 MG PO CAPS
300.0000 mg | ORAL_CAPSULE | Freq: Two times a day (BID) | ORAL | Status: DC | PRN
Start: 2017-10-20 — End: 2017-10-22
  Administered 2017-10-21: 300 mg via ORAL
  Filled 2017-10-20: qty 4

## 2017-10-20 MED ORDER — ZOLPIDEM TARTRATE 5 MG PO TABS: 5.0000 mg | ORAL_TABLET | Freq: Every evening | ORAL | Status: DC | PRN

## 2017-10-20 MED ORDER — FENTANYL CITRATE (PF) 100 MCG/2ML IJ SOLN
100.0000 ug | Freq: Once | INTRAMUSCULAR | Status: AC
  Administered 2017-10-20: 100 ug via INTRAVENOUS

## 2017-10-20 MED ORDER — SODIUM CHLORIDE 0.9 % IV SOLN: 250.0000 mL | INTRAVENOUS | Status: DC

## 2017-10-20 MED ORDER — DEXAMETHASONE SODIUM PHOSPHATE 10 MG/ML IJ SOLN
INTRAMUSCULAR | Status: DC | PRN
  Administered 2017-10-20: 10 mg via INTRAVENOUS

## 2017-10-20 MED ORDER — FENTANYL CITRATE (PF) 100 MCG/2ML IJ SOLN
INTRAMUSCULAR | Status: DC | PRN
  Administered 2017-10-20 (×2): 50 ug via INTRAVENOUS
  Administered 2017-10-20: 100 ug via INTRAVENOUS
  Administered 2017-10-20: 50 ug via INTRAVENOUS

## 2017-10-20 MED ORDER — ACETAMINOPHEN 325 MG PO TABS: 650.0000 mg | ORAL_TABLET | ORAL | Status: DC | PRN

## 2017-10-20 MED ORDER — DIAZEPAM 5 MG PO TABS: 5.0000 mg | ORAL_TABLET | Freq: Four times a day (QID) | ORAL | Status: DC | PRN

## 2017-10-20 MED ORDER — ALUM & MAG HYDROXIDE-SIMETH 200-200-20 MG/5ML PO SUSP: 30.0000 mL | Freq: Four times a day (QID) | ORAL | Status: DC | PRN

## 2017-10-20 MED ORDER — THROMBIN (RECOMBINANT) 5000 UNITS EX SOLR
CUTANEOUS | Status: AC
  Filled 2017-10-20: qty 10000

## 2017-10-20 MED ORDER — MORPHINE SULFATE (PF) 2 MG/ML IV SOLN
1.0000 mg | INTRAVENOUS | Status: DC | PRN
  Administered 2017-10-20 – 2017-10-21 (×3): 2 mg via INTRAVENOUS
  Filled 2017-10-20 (×3): qty 1

## 2017-10-20 MED ORDER — EPHEDRINE 5 MG/ML INJ
INTRAVENOUS | Status: AC
  Filled 2017-10-20: qty 10

## 2017-10-20 MED ORDER — ROCURONIUM BROMIDE 50 MG/5ML IV SOSY
PREFILLED_SYRINGE | INTRAVENOUS | Status: DC | PRN
  Administered 2017-10-20 (×2): 20 mg via INTRAVENOUS
  Administered 2017-10-20: 50 mg via INTRAVENOUS
  Administered 2017-10-20: 20 mg via INTRAVENOUS

## 2017-10-20 MED ORDER — HYDROMORPHONE HCL 1 MG/ML IJ SOLN
0.2500 mg | INTRAMUSCULAR | Status: DC | PRN
  Administered 2017-10-20 (×4): 0.5 mg via INTRAVENOUS

## 2017-10-20 MED ORDER — PROPOFOL 10 MG/ML IV BOLUS
INTRAVENOUS | Status: AC
  Filled 2017-10-20: qty 20

## 2017-10-20 MED ORDER — PROMETHAZINE HCL 25 MG/ML IJ SOLN: 6.2500 mg | INTRAMUSCULAR | Status: DC | PRN

## 2017-10-20 MED ORDER — ATORVASTATIN CALCIUM 10 MG PO TABS
20.0000 mg | ORAL_TABLET | Freq: Every day | ORAL | Status: DC
  Administered 2017-10-21 – 2017-10-22 (×2): 20 mg via ORAL
  Filled 2017-10-20 (×2): qty 2

## 2017-10-20 MED ORDER — OXYCODONE HCL 5 MG/5ML PO SOLN: 5.0000 mg | Freq: Once | ORAL | Status: DC | PRN

## 2017-10-20 SURGICAL SUPPLY — 95 items
ADH SKN CLS APL DERMABOND .7 (GAUZE/BANDAGES/DRESSINGS) ×2
APL SKNCLS STERI-STRIP NONHPOA (GAUZE/BANDAGES/DRESSINGS) ×2
APPLIER CLIP 11 MED OPEN (CLIP) ×8
APR CLP MED 11 20 MLT OPN (CLIP) ×4
BENZOIN TINCTURE PRP APPL 2/3 (GAUZE/BANDAGES/DRESSINGS) ×2 IMPLANT
BLADE CLIPPER SURG (BLADE) IMPLANT
BLADE SURG 10 STRL SS (BLADE) ×4 IMPLANT
BONE SCREW THREAD 6.5X35MM (Screw) ×1 IMPLANT
BONE VIVIGEN FORMABLE 10CC (Bone Implant) ×4 IMPLANT
CAGE COUGAR ALIF 16 10D (Cage) ×1 IMPLANT
CAGE COUGAR ALIF 16MM 10 (Cage) ×1 IMPLANT
CLIP APPLIE 11 MED OPEN (CLIP) ×2 IMPLANT
CLIP LIGATING EXTRA MED SLVR (CLIP) ×4 IMPLANT
CLIP LIGATING EXTRA SM BLUE (MISCELLANEOUS) ×4 IMPLANT
CLOSURE STERI-STRIP 1/2X4 (GAUZE/BANDAGES/DRESSINGS) ×1
CLOSURE WOUND 1/2 X4 (GAUZE/BANDAGES/DRESSINGS)
CLSR STERI-STRIP ANTIMIC 1/2X4 (GAUZE/BANDAGES/DRESSINGS) ×1 IMPLANT
CORDS BIPOLAR (ELECTRODE) ×4 IMPLANT
COVER SURGICAL LIGHT HANDLE (MISCELLANEOUS) ×4 IMPLANT
DERMABOND ADVANCED (GAUZE/BANDAGES/DRESSINGS) ×2
DERMABOND ADVANCED .7 DNX12 (GAUZE/BANDAGES/DRESSINGS) ×2 IMPLANT
DRAPE C-ARM 42X72 X-RAY (DRAPES) ×8 IMPLANT
DRAPE POUCH INSTRU U-SHP 10X18 (DRAPES) ×4 IMPLANT
DRAPE SURG 17X23 STRL (DRAPES) ×12 IMPLANT
DRSG MEPILEX BORDER 4X12 (GAUZE/BANDAGES/DRESSINGS) ×4 IMPLANT
DURAPREP 26ML APPLICATOR (WOUND CARE) ×4 IMPLANT
ELECT BLADE 4.0 EZ CLEAN MEGAD (MISCELLANEOUS) ×8
ELECT CAUTERY BLADE 6.4 (BLADE) ×4 IMPLANT
ELECT REM PT RETURN 9FT ADLT (ELECTROSURGICAL) ×4
ELECTRODE BLDE 4.0 EZ CLN MEGD (MISCELLANEOUS) ×4 IMPLANT
ELECTRODE REM PT RTRN 9FT ADLT (ELECTROSURGICAL) ×2 IMPLANT
GAUZE SPONGE 4X4 12PLY STRL (GAUZE/BANDAGES/DRESSINGS) ×2 IMPLANT
GAUZE SPONGE 4X4 16PLY XRAY LF (GAUZE/BANDAGES/DRESSINGS) IMPLANT
GLOVE BIO SURGEON STRL SZ7 (GLOVE) ×4 IMPLANT
GLOVE BIO SURGEON STRL SZ8 (GLOVE) ×4 IMPLANT
GLOVE BIOGEL PI IND STRL 7.0 (GLOVE) ×2 IMPLANT
GLOVE BIOGEL PI IND STRL 8 (GLOVE) ×4 IMPLANT
GLOVE BIOGEL PI INDICATOR 7.0 (GLOVE) ×2
GLOVE BIOGEL PI INDICATOR 8 (GLOVE) ×4
GLOVE SS BIOGEL STRL SZ 7.5 (GLOVE) ×2 IMPLANT
GLOVE SUPERSENSE BIOGEL SZ 7.5 (GLOVE) ×2
GOWN STRL REUS W/ TWL LRG LVL3 (GOWN DISPOSABLE) ×6 IMPLANT
GOWN STRL REUS W/ TWL XL LVL3 (GOWN DISPOSABLE) ×2 IMPLANT
GOWN STRL REUS W/TWL LRG LVL3 (GOWN DISPOSABLE) ×12
GOWN STRL REUS W/TWL XL LVL3 (GOWN DISPOSABLE) ×4
GRAFT BNE MATRIX VG FRMBL L 10 (Bone Implant) IMPLANT
HEMOSTAT SURGICEL 2X14 (HEMOSTASIS) IMPLANT
INSERT FOGARTY 61MM (MISCELLANEOUS) IMPLANT
INSERT FOGARTY SM (MISCELLANEOUS) IMPLANT
KIT BASIN OR (CUSTOM PROCEDURE TRAY) ×4 IMPLANT
KIT ROOM TURNOVER OR (KITS) ×4 IMPLANT
LOOP VESSEL MAXI BLUE (MISCELLANEOUS) IMPLANT
LOOP VESSEL MINI RED (MISCELLANEOUS) IMPLANT
NDL HYPO 25GX1X1/2 BEV (NEEDLE) ×2 IMPLANT
NDL SPNL 18GX3.5 QUINCKE PK (NEEDLE) ×2 IMPLANT
NEEDLE HYPO 25GX1X1/2 BEV (NEEDLE) ×4 IMPLANT
NEEDLE SPNL 18GX3.5 QUINCKE PK (NEEDLE) ×4 IMPLANT
NS IRRIG 1000ML POUR BTL (IV SOLUTION) ×4 IMPLANT
PACK LAMINECTOMY ORTHO (CUSTOM PROCEDURE TRAY) ×4 IMPLANT
PACK UNIVERSAL I (CUSTOM PROCEDURE TRAY) ×4 IMPLANT
PAD ARMBOARD 7.5X6 YLW CONV (MISCELLANEOUS) ×16 IMPLANT
SCREW BONE THREAD 6.5X35 (Screw) ×1 IMPLANT
SPONGE INTESTINAL PEANUT (DISPOSABLE) ×8 IMPLANT
SPONGE LAP 18X18 X RAY DECT (DISPOSABLE) IMPLANT
SPONGE LAP 4X18 X RAY DECT (DISPOSABLE) IMPLANT
SPONGE SURGIFOAM ABS GEL 100 (HEMOSTASIS) ×8 IMPLANT
STAPLER VISISTAT 35W (STAPLE) IMPLANT
STRIP CLOSURE SKIN 1/2X4 (GAUZE/BANDAGES/DRESSINGS) IMPLANT
SURGIFLO W/THROMBIN 8M KIT (HEMOSTASIS) IMPLANT
SUT MNCRL AB 4-0 PS2 18 (SUTURE) ×4 IMPLANT
SUT PDS AB 1 CTX 36 (SUTURE) ×8 IMPLANT
SUT PROLENE 4 0 RB 1 (SUTURE)
SUT PROLENE 4-0 RB1 .5 CRCL 36 (SUTURE) IMPLANT
SUT PROLENE 5 0 C 1 24 (SUTURE) IMPLANT
SUT PROLENE 5 0 CC1 (SUTURE) ×2 IMPLANT
SUT PROLENE 6 0 C 1 30 (SUTURE) ×4 IMPLANT
SUT PROLENE 6 0 CC (SUTURE) IMPLANT
SUT SILK 0 TIES 10X30 (SUTURE) ×4 IMPLANT
SUT SILK 2 0 TIES 10X30 (SUTURE) ×8 IMPLANT
SUT SILK 2 0SH CR/8 30 (SUTURE) IMPLANT
SUT SILK 3 0 TIES 10X30 (SUTURE) ×8 IMPLANT
SUT SILK 3 0SH CR/8 30 (SUTURE) IMPLANT
SUT VIC AB 1 CT1 27 (SUTURE) ×8
SUT VIC AB 1 CT1 27XBRD ANBCTR (SUTURE) ×4 IMPLANT
SUT VIC AB 1 CTX 36 (SUTURE) ×8
SUT VIC AB 1 CTX36XBRD ANBCTR (SUTURE) ×4 IMPLANT
SUT VIC AB 2-0 CT2 18 VCP726D (SUTURE) ×4 IMPLANT
SYR BULB IRRIGATION 50ML (SYRINGE) ×4 IMPLANT
TAPE CLOTH SURG 6X10 WHT LF (GAUZE/BANDAGES/DRESSINGS) ×2 IMPLANT
TOWEL OR 17X24 6PK STRL BLUE (TOWEL DISPOSABLE) ×4 IMPLANT
TOWEL OR 17X26 10 PK STRL BLUE (TOWEL DISPOSABLE) ×4 IMPLANT
TRAY FOLEY CATH SILVER 16FR (SET/KITS/TRAYS/PACK) ×4 IMPLANT
WASHER 13.0MM (Orthopedic Implant) ×2 IMPLANT
WATER STERILE IRR 1000ML POUR (IV SOLUTION) ×4 IMPLANT
YANKAUER SUCT BULB TIP NO VENT (SUCTIONS) ×4 IMPLANT

## 2017-10-20 NOTE — Transfer of Care (Signed)
Immediate Anesthesia Transfer of Care Note  Patient: [name] [name]  Procedure(s) Performed: LUMBAR 5-SACRUM 1 ANTERIOR LUMBAR INTERBODY FUSION WITH INSTRUMENTATION AND ALLOGRAFT; REQUEST 3 HOURS (N/A Spine Lumbar) ABDOMINAL EXPOSURE (N/A Abdomen)  Patient Location: PACU  Anesthesia Type:General  Level of Consciousness: awake, alert  and oriented  Airway & Oxygen Therapy: Patient Spontanous Breathing and Patient connected to face mask oxygen  Post-op Assessment: Report given to RN and Post -op Vital signs reviewed and stable  Post vital signs: Reviewed and stable  Last Vitals:  Vitals:   10/20/17 0905  BP: (!) 133/92  Pulse: 76  Resp: 19  Temp: 36.7 C  SpO2: 98%    Last Pain:  Vitals:   10/20/17 0946  TempSrc:   PainSc: 5          Complications: No apparent anesthesia complications

## 2017-10-20 NOTE — Anesthesia Preprocedure Evaluation (Addendum)
Anesthesia Evaluation  Patient identified by MRN, date of birth, ID band Patient awake    Reviewed: Allergy & Precautions, NPO status   Airway Mallampati: II       Dental  (+) Partial Upper,    Pulmonary former smoker,    breath sounds clear to auscultation       Cardiovascular hypertension, Pt. on medications  Rhythm:Regular Rate:Normal  ECG: NSR, rate 97   Neuro/Psych Anxiety    GI/Hepatic negative GI ROS, Neg liver ROS,   Endo/Other  Hypothyroidism   Renal/GU negative Renal ROS     Musculoskeletal Left leg pain Neuropathy   Abdominal   Peds  Hematology   Anesthesia Other Findings HLD  Reproductive/Obstetrics                            Anesthesia Physical  Anesthesia Plan  ASA: III  Anesthesia Plan: General   Post-op Pain Management:    Induction: Intravenous  PONV Risk Score and Plan: 2 and Ondansetron and Midazolam  Airway Management Planned: Oral ETT  Additional Equipment:   Intra-op Plan:   Post-operative Plan: Extubation in OR  Informed Consent:   Dental advisory given  Plan Discussed with: CRNA  Anesthesia Plan Comments:        Anesthesia Quick Evaluation

## 2017-10-20 NOTE — H&P (Signed)
PREOPERATIVE H&P  Chief Complaint: Left leg pain  HPI: [name] [name] is a 56 y.o. male who presents with ongoing pain in the left leg  MRI reveals NF stenosis on the left at L5/S1. Patient is s/p a previous L5/S1 decompression 3 years ago  Patient has failed multiple forms of conservative care and continues to have pain (see office notes for additional details regarding the patient's full course of treatment)  Past Medical History:  Diagnosis Date  . Anxiety   . Hypertension   . Hypothyroidism   . Neuropathy     left foot  . Sleep apnea    Past Surgical History:  Procedure Laterality Date  . COLONOSCOPY    . KNEE ARTHROSCOPY Bilateral 5-10 years ago   both knees  . OPEN ANTERIOR SHOULDER RECONSTRUCTION Right 10 years ago  . SHOULDER ARTHROSCOPY  5-10 years ago   both shoulder  . VARICOSE VEIN SURGERY Bilateral 4 yrs ago    Social History   Socioeconomic History  . Marital status: Married    Spouse name: Not on file  . Number of children: Not on file  . Years of education: Not on file  . Highest education level: Not on file  Social Needs  . Financial resource strain: Not on file  . Food insecurity - worry: Not on file  . Food insecurity - inability: Not on file  . Transportation needs - medical: Not on file  . Transportation needs - non-medical: Not on file  Occupational History  . Not on file  Tobacco Use  . Smoking status: Former Smoker    Types: Cigarettes    Last attempt to quit: 2003    Years since quitting: 15.8  . Smokeless tobacco: Never Used  . Tobacco comment: was a light smoker maybe 3 a week  Substance and Sexual Activity  . Alcohol use: No  . Drug use: No  . Sexual activity: Not on file  Other Topics Concern  . Not on file  Social History Narrative  . Not on file   Family History  Problem Relation Age of Onset  . Diabetes Mother   . Diabetes Brother   . Chronic Renal Failure Brother    No Known Allergies Prior to Admission  medications   Medication Sig Start Date End Date Taking? Authorizing Provider  ALPRAZolam Prudy Feeler(XANAX) 0.5 MG tablet Take 0.25 mg by mouth 2 (two) times daily as needed for sleep.    Yes [provider]  aspirin EC 81 MG tablet Take 81 mg by mouth daily.   Yes [provider]  atorvastatin (LIPITOR) 20 MG tablet Take 20 mg by mouth daily.   Yes [provider]  buPROPion (WELLBUTRIN XL) 300 MG 24 hr tablet Take 300 mg by mouth daily.   Yes [provider]  celecoxib (CELEBREX) 200 MG capsule Take 200 mg by mouth daily.   Yes [provider]  ciprofloxacin-hydrocortisone (CIPRO HC OTIC) OTIC suspension Place 3 drops into both ears 2 (two) times daily.   Yes [provider]  clindamycin (CLINDAGEL) 1 % gel Apply 1 application topically 2 (two) times daily.   Yes [provider]  cycloSPORINE (RESTASIS) 0.05 % ophthalmic emulsion Place 1 drop into both eyes 2 (two) times daily.   Yes [provider]  diazepam (VALIUM) 5 MG tablet Take 5 mg by mouth every 6 (six) hours as needed for muscle spasms.   Yes [provider]  diclofenac sodium (VOLTAREN)  1 % GEL Apply 2 g topically 4 (four) times daily.   Yes [provider]  levothyroxine (SYNTHROID, LEVOTHROID) 200 MCG tablet Take 200 mcg by mouth daily before breakfast.   Yes [provider]  lisinopril-hydrochlorothiazide (PRINZIDE,ZESTORETIC) 20-12.5 MG per tablet Take 1 tablet by mouth daily.   Yes [provider]  meloxicam (MOBIC) 15 MG tablet Take 15 mg by mouth daily.   Yes [provider]  montelukast (SINGULAIR) 10 MG tablet Take 10 mg by mouth daily.   Yes [provider]  oxyCODONE-acetaminophen (PERCOCET/ROXICET) 5-325 MG tablet Take 1 tablet by mouth every 6 (six) hours as needed for severe pain.   Yes [provider]  pregabalin (LYRICA) 300 MG capsule Take 300 mg by mouth 2 (two) times daily as needed (pain).    Yes [provider]  traMADol (ULTRAM) 50 MG tablet Take 50 mg by mouth every 6 (six) hours as needed for moderate pain.   Yes [provider]     All other systems have been reviewed and were otherwise negative with the exception of those mentioned in the HPI and as above.  Physical Exam: There were no vitals filed for this visit.  General: Alert, no acute distress Cardiovascular: No pedal edema Respiratory: No cyanosis, no use of accessory musculature Skin: No lesions in the area of chief complaint Neurologic: Sensation intact distally Psychiatric: Patient is competent for consent with normal mood and affect Lymphatic: No axillary or cervical lymphadenopathy  MUSCULOSKELETAL: + SLR on the left  Assessment/Plan: Left leg pain Plan for Procedure(s): LUMBAR 5-SACRUM 1 ANTERIOR LUMBAR INTERBODY FUSION WITH INSTRUMENTATION AND ALLOGRAFT   [name] [name],[name] Goree LEONARD, MD 10/20/2017 7:57 AM

## 2017-10-20 NOTE — Anesthesia Procedure Notes (Signed)
Procedure Name: Intubation Date/Time: 10/20/2017 12:45 PM Performed by: Pearson Grippeobertson, Yaviel Kloster M, CRNA Pre-anesthesia Checklist: Patient identified, Emergency Drugs available, Suction available and Patient being monitored Patient Re-evaluated:Patient Re-evaluated prior to induction Oxygen Delivery Method: Circle system utilized Preoxygenation: Pre-oxygenation with 100% oxygen Induction Type: IV induction Ventilation: Mask ventilation without difficulty Laryngoscope Size: Miller and 2 Grade View: Grade I Tube type: Oral Tube size: 7.0 mm Number of attempts: 1 Airway Equipment and Method: Stylet and Oral airway Placement Confirmation: ETT inserted through vocal cords under direct vision,  positive ETCO2 and breath sounds checked- equal and bilateral Secured at: 22 cm Tube secured with: Tape Dental Injury: Teeth and Oropharynx as per pre-operative assessment

## 2017-10-20 NOTE — Progress Notes (Signed)
Pt arrived to unit, requesting food. Pt understands he is NPO at midnight for next surgery. Wife at bedside. Pt is stable, will continue to monitor.

## 2017-10-20 NOTE — Anesthesia Postprocedure Evaluation (Signed)
Anesthesia Post Note  Patient: Orpha BurLouis E Duey  Procedure(s) Performed: LUMBAR 5-SACRUM 1 ANTERIOR LUMBAR INTERBODY FUSION WITH INSTRUMENTATION AND ALLOGRAFT; REQUEST 3 HOURS (N/A Spine Lumbar) ABDOMINAL EXPOSURE (N/A Abdomen)     Patient location during evaluation: PACU Anesthesia Type: General Level of consciousness: awake Pain management: pain level controlled Vital Signs Assessment: post-procedure vital signs reviewed and stable Respiratory status: spontaneous breathing Cardiovascular status: stable Anesthetic complications: no    Last Vitals:  Vitals:   10/20/17 1745 10/20/17 1756  BP:  130/87  Pulse: 92 91  Resp: 17 15  Temp: (!) 36.3 C   SpO2: 100% 99%    Last Pain:  Vitals:   10/20/17 1550  TempSrc:   PainSc: 6                  Reshaun Briseno

## 2017-10-20 NOTE — Op Note (Signed)
    OPERATIVE REPORT  DATE OF SURGERY: 10/20/2017  PATIENT: [name] [name], 56 y.o. male MRN: [id number]  DOB: 03/02/1961  PRE-OPERATIVE DIAGNOSIS: Generative disc disease L5-S1  POST-OPERATIVE DIAGNOSIS:  Same  PROCEDURE: Anterior exposure for L5-S1 disc surgery  SURGEON:  Gretta Beganodd Avani Sensabaugh, M.D.  Co-surgeon for the exposure Dr. Estill BambergMark Dumonski  ANESTHESIA: General  EBL: 250 ml  Total I/O In: 1300 [I.V.:1300] Out: 400 [Urine:150; Blood:250]  BLOOD ADMINISTERED: None  DRAINS: None  SPECIMEN: None  COUNTS CORRECT:  YES  PLAN OF CARE: PACU  PATIENT DISPOSITION:  PACU - hemodynamically stable  PROCEDURE DETAILS: Patient was taken to the operative placed supine position with the area of the abdomen was prepped and draped you sterile fashion.  Lateral C-arm projection was used to determine the level of the L5-S1 disc.  Vision was made from the midline to the left carried down to the anterior rectus sheath through the subcutaneous fat with electrocautery.  The anterior rectus sheath was opened in line with the skin incision.  The retroperitoneal space was entered bluntly and the intraperitoneal contents were mobilized to the right.  The left ureter was mobilized to the right as well.  The dissection plane was above the level of the iliac vessels.  Blunt dissection was used to mobilize the iliac vessels to the right and left of the L5-S1 disc.  The middle sacral vessels were controlled with ligaclips and divided.  The [name] retractor was brought into the field and the reversely 0.50 blades were positioned to the right and left of the L5-S1 disc.  Anterior and anterior exposure was used with malleable retractors.  The needle was placed in the L5-S1 disc and x-ray was used to confirm that this was indeed the correct disc space.  The remainder of the dictation will be dictated as a separate note by Dr. [name]   [name] F. [name], M.D., [facility] 10/20/2017 4:20 PM

## 2017-10-21 ENCOUNTER — Inpatient Hospital Stay (HOSPITAL_COMMUNITY): Payer: 59 | Admitting: Certified Registered"

## 2017-10-21 ENCOUNTER — Other Ambulatory Visit: Payer: Self-pay

## 2017-10-21 ENCOUNTER — Inpatient Hospital Stay (HOSPITAL_COMMUNITY): Admission: RE | Admit: 2017-10-21 | Payer: 59 | Source: Ambulatory Visit | Admitting: Orthopedic Surgery

## 2017-10-21 ENCOUNTER — Encounter (HOSPITAL_COMMUNITY): Payer: Self-pay | Admitting: Surgery

## 2017-10-21 ENCOUNTER — Encounter (HOSPITAL_COMMUNITY)
Admission: RE | Disposition: A | Discharge status: Home / Self Care | Payer: Self-pay | Source: Home / Self Care | Attending: Orthopedic Surgery

## 2017-10-21 ENCOUNTER — Inpatient Hospital Stay (HOSPITAL_COMMUNITY): Payer: 59

## 2017-10-21 SURGERY — POSTERIOR LUMBAR FUSION 1 LEVEL
Anesthesia: General | Site: Spine Lumbar

## 2017-10-21 MED ORDER — SUCCINYLCHOLINE CHLORIDE 200 MG/10ML IV SOSY
PREFILLED_SYRINGE | INTRAVENOUS | Status: AC
  Filled 2017-10-21: qty 10

## 2017-10-21 MED ORDER — HYDROMORPHONE HCL 1 MG/ML IJ SOLN: 0.2500 mg | INTRAMUSCULAR | Status: DC | PRN

## 2017-10-21 MED ORDER — 0.9 % SODIUM CHLORIDE (POUR BTL) OPTIME
TOPICAL | Status: DC | PRN
  Administered 2017-10-21: 1000 mL

## 2017-10-21 MED ORDER — GELATIN ABSORBABLE 12-7 MM EX MISC
CUTANEOUS | Status: DC | PRN
  Administered 2017-10-21: 1

## 2017-10-21 MED ORDER — FENTANYL CITRATE (PF) 250 MCG/5ML IJ SOLN
INTRAMUSCULAR | Status: AC
  Filled 2017-10-21: qty 5

## 2017-10-21 MED ORDER — ONDANSETRON HCL 4 MG/2ML IJ SOLN
INTRAMUSCULAR | Status: DC | PRN
  Administered 2017-10-21: 4 mg via INTRAVENOUS

## 2017-10-21 MED ORDER — BUPIVACAINE LIPOSOME 1.3 % IJ SUSP
INTRAMUSCULAR | Status: DC | PRN
  Administered 2017-10-21: 20 mL

## 2017-10-21 MED ORDER — PHENYLEPHRINE HCL 10 MG/ML IJ SOLN
INTRAMUSCULAR | Status: DC | PRN
  Administered 2017-10-21: 120 ug via INTRAVENOUS

## 2017-10-21 MED ORDER — ONDANSETRON HCL 4 MG/2ML IJ SOLN
INTRAMUSCULAR | Status: AC
  Filled 2017-10-21: qty 2

## 2017-10-21 MED ORDER — THROMBIN (RECOMBINANT) 5000 UNITS EX SOLR
CUTANEOUS | Status: AC
  Filled 2017-10-21: qty 15000

## 2017-10-21 MED ORDER — PROPOFOL 10 MG/ML IV BOLUS
INTRAVENOUS | Status: DC | PRN
  Administered 2017-10-21: 200 mg via INTRAVENOUS
  Administered 2017-10-21: 50 mg via INTRAVENOUS

## 2017-10-21 MED ORDER — DEXTROSE 5 % IV SOLN
INTRAVENOUS | Status: DC | PRN
  Administered 2017-10-21: 50 ug/min via INTRAVENOUS

## 2017-10-21 MED ORDER — FENTANYL CITRATE (PF) 250 MCG/5ML IJ SOLN
INTRAMUSCULAR | Status: DC | PRN
  Administered 2017-10-21 (×2): 50 ug via INTRAVENOUS
  Administered 2017-10-21: 100 ug via INTRAVENOUS
  Administered 2017-10-21 (×3): 50 ug via INTRAVENOUS
  Administered 2017-10-21: 150 ug via INTRAVENOUS

## 2017-10-21 MED ORDER — PROPOFOL 10 MG/ML IV BOLUS
INTRAVENOUS | Status: AC
  Filled 2017-10-21: qty 20

## 2017-10-21 MED ORDER — LIDOCAINE 2% (20 MG/ML) 5 ML SYRINGE
INTRAMUSCULAR | Status: AC
  Filled 2017-10-21: qty 5

## 2017-10-21 MED ORDER — DIAZEPAM 5 MG PO TABS
ORAL_TABLET | ORAL | Status: AC
  Filled 2017-10-21: qty 1

## 2017-10-21 MED ORDER — OXYCODONE-ACETAMINOPHEN 5-325 MG PO TABS
ORAL_TABLET | ORAL | Status: AC
  Filled 2017-10-21: qty 2

## 2017-10-21 MED ORDER — SUCCINYLCHOLINE CHLORIDE 20 MG/ML IJ SOLN
INTRAMUSCULAR | Status: DC | PRN
  Administered 2017-10-21: 120 mg via INTRAVENOUS

## 2017-10-21 MED ORDER — BUPIVACAINE-EPINEPHRINE (PF) 0.25% -1:200000 IJ SOLN
INTRAMUSCULAR | Status: AC
  Filled 2017-10-21: qty 30

## 2017-10-21 MED ORDER — DEXAMETHASONE SODIUM PHOSPHATE 10 MG/ML IJ SOLN
INTRAMUSCULAR | Status: DC | PRN
  Administered 2017-10-21: 10 mg via INTRAVENOUS

## 2017-10-21 MED ORDER — PROPOFOL 500 MG/50ML IV EMUL
INTRAVENOUS | Status: DC | PRN
  Administered 2017-10-21: 30 ug/kg/min via INTRAVENOUS

## 2017-10-21 MED ORDER — DEXAMETHASONE SODIUM PHOSPHATE 10 MG/ML IJ SOLN
INTRAMUSCULAR | Status: AC
  Filled 2017-10-21: qty 1

## 2017-10-21 MED ORDER — BUPIVACAINE-EPINEPHRINE 0.25% -1:200000 IJ SOLN
INTRAMUSCULAR | Status: DC | PRN
  Administered 2017-10-21: 20 mL
  Administered 2017-10-21: 10 mL

## 2017-10-21 MED ORDER — LACTATED RINGERS IV SOLN
INTRAVENOUS | Status: DC
  Administered 2017-10-21 (×2): via INTRAVENOUS

## 2017-10-21 MED ORDER — BUPIVACAINE LIPOSOME 1.3 % IJ SUSP
20.0000 mL | INTRAMUSCULAR | Status: DC
  Filled 2017-10-21: qty 20

## 2017-10-21 MED ORDER — MORPHINE SULFATE (PF) 4 MG/ML IV SOLN
INTRAVENOUS | Status: AC
  Filled 2017-10-21: qty 1

## 2017-10-21 MED ORDER — MIDAZOLAM HCL 5 MG/5ML IJ SOLN
INTRAMUSCULAR | Status: DC | PRN
  Administered 2017-10-21: 2 mg via INTRAVENOUS

## 2017-10-21 MED ORDER — ESMOLOL HCL 100 MG/10ML IV SOLN
INTRAVENOUS | Status: AC
  Filled 2017-10-21: qty 10

## 2017-10-21 MED ORDER — LIDOCAINE HCL (CARDIAC) 20 MG/ML IV SOLN
INTRAVENOUS | Status: DC | PRN
  Administered 2017-10-21: 60 mg via INTRATRACHEAL

## 2017-10-21 MED ORDER — MIDAZOLAM HCL 2 MG/2ML IJ SOLN
INTRAMUSCULAR | Status: AC
  Filled 2017-10-21: qty 2

## 2017-10-21 MED ORDER — MORPHINE SULFATE (PF) 2 MG/ML IV SOLN
2.0000 mg | Freq: Once | INTRAVENOUS | Status: AC
Start: 2017-10-21 — End: 2017-10-21
  Administered 2017-10-21: 2 mg via INTRAVENOUS

## 2017-10-21 MED FILL — Heparin Sodium (Porcine) Inj 1000 Unit/ML: INTRAMUSCULAR | Qty: 30 | Status: AC

## 2017-10-21 MED FILL — Sodium Chloride IV Soln 0.9%: INTRAVENOUS | Qty: 1000 | Status: AC

## 2017-10-21 SURGICAL SUPPLY — 96 items
APL SKNCLS STERI-STRIP NONHPOA (GAUZE/BANDAGES/DRESSINGS) ×1
BENZOIN TINCTURE PRP APPL 2/3 (GAUZE/BANDAGES/DRESSINGS) ×3 IMPLANT
BLADE CLIPPER SURG (BLADE) ×2 IMPLANT
BONE VIVIGEN FORMABLE 1.3CC (Bone Implant) ×3 IMPLANT
BUR PRESCISION 1.7 ELITE (BURR) ×1 IMPLANT
BUR ROUND FLUTED 5 RND (BURR) ×1 IMPLANT
BUR ROUND FLUTED 5MM RND (BURR) ×1
BUR ROUND PRECISION 4.0 (BURR) IMPLANT
BUR ROUND PRECISION 4.0MM (BURR)
BUR SABER RD CUTTING 3.0 (BURR) IMPLANT
BUR SABER RD CUTTING 3.0MM (BURR)
CARTRIDGE OIL MAESTRO DRILL (MISCELLANEOUS) ×1 IMPLANT
CLOSURE STERI-STRIP 1/2X4 (GAUZE/BANDAGES/DRESSINGS) ×1
CLOSURE WOUND 1/2 X4 (GAUZE/BANDAGES/DRESSINGS) ×1
CLSR STERI-STRIP ANTIMIC 1/2X4 (GAUZE/BANDAGES/DRESSINGS) ×1 IMPLANT
CONT SPEC 4OZ CLIKSEAL STRL BL (MISCELLANEOUS) ×1 IMPLANT
COUNTER NEEDLE 20 DBL MAG RED (NEEDLE) ×2 IMPLANT
COVER MAYO STAND STRL (DRAPES) ×6 IMPLANT
COVER SURGICAL LIGHT HANDLE (MISCELLANEOUS) ×3 IMPLANT
DIFFUSER DRILL AIR PNEUMATIC (MISCELLANEOUS) ×3 IMPLANT
DRAIN CHANNEL 15F RND FF W/TCR (WOUND CARE) IMPLANT
DRAPE C-ARM 42X72 X-RAY (DRAPES) ×3 IMPLANT
DRAPE C-ARMOR (DRAPES) ×2 IMPLANT
DRAPE POUCH INSTRU U-SHP 10X18 (DRAPES) ×3 IMPLANT
DRAPE SURG 17X23 STRL (DRAPES) ×12 IMPLANT
DURAPREP 26ML APPLICATOR (WOUND CARE) ×3 IMPLANT
ELECT BLADE 4.0 EZ CLEAN MEGAD (MISCELLANEOUS) ×3
ELECT CAUTERY BLADE 6.4 (BLADE) ×3 IMPLANT
ELECT REM PT RETURN 9FT ADLT (ELECTROSURGICAL) ×3
ELECTRODE BLDE 4.0 EZ CLN MEGD (MISCELLANEOUS) ×1 IMPLANT
ELECTRODE REM PT RTRN 9FT ADLT (ELECTROSURGICAL) ×1 IMPLANT
EVACUATOR SILICONE 100CC (DRAIN) IMPLANT
FEE INTRAOP MONITOR IMPULS NCS (MISCELLANEOUS) IMPLANT
GAUZE SPONGE 4X4 12PLY STRL (GAUZE/BANDAGES/DRESSINGS) ×3 IMPLANT
GAUZE SPONGE 4X4 16PLY XRAY LF (GAUZE/BANDAGES/DRESSINGS) ×3 IMPLANT
GLOVE BIO SURGEON STRL SZ7 (GLOVE) ×3 IMPLANT
GLOVE BIO SURGEON STRL SZ8 (GLOVE) ×3 IMPLANT
GLOVE BIOGEL PI IND STRL 7.0 (GLOVE) ×1 IMPLANT
GLOVE BIOGEL PI IND STRL 8 (GLOVE) ×1 IMPLANT
GLOVE BIOGEL PI INDICATOR 7.0 (GLOVE) ×2
GLOVE BIOGEL PI INDICATOR 8 (GLOVE) ×2
GOWN STRL REUS W/ TWL LRG LVL3 (GOWN DISPOSABLE) ×2 IMPLANT
GOWN STRL REUS W/ TWL XL LVL3 (GOWN DISPOSABLE) ×1 IMPLANT
GOWN STRL REUS W/TWL LRG LVL3 (GOWN DISPOSABLE) ×6
GOWN STRL REUS W/TWL XL LVL3 (GOWN DISPOSABLE) ×3
GRAFT BNE MATRIX VG FRMBL SM 1 (Bone Implant) IMPLANT
GUIDEWIRE BLUNT VIPER II 1.45 (WIRE) ×2 IMPLANT
GUIDEWIRE SHARP VIPER II (WIRE) ×8 IMPLANT
INTRAOP MONITOR FEE IMPULS NCS (MISCELLANEOUS) ×1
INTRAOP MONITOR FEE IMPULSE (MISCELLANEOUS) ×2
IV CATH 14GX2 1/4 (CATHETERS) ×3 IMPLANT
KIT BASIN OR (CUSTOM PROCEDURE TRAY) ×3 IMPLANT
KIT POSITION SURG JACKSON T1 (MISCELLANEOUS) ×3 IMPLANT
KIT ROOM TURNOVER OR (KITS) ×3 IMPLANT
MARKER SKIN DUAL TIP RULER LAB (MISCELLANEOUS) ×3 IMPLANT
NDL HYPO 25GX1X1/2 BEV (NEEDLE) ×1 IMPLANT
NDL JAMSHIDI VIPER (NEEDLE) IMPLANT
NDL SAFETY ECLIPSE 18X1.5 (NEEDLE) ×1 IMPLANT
NDL SPNL 18GX3.5 QUINCKE PK (NEEDLE) ×2 IMPLANT
NEEDLE 22X1 1/2 (OR ONLY) (NEEDLE) ×6 IMPLANT
NEEDLE HYPO 18GX1.5 SHARP (NEEDLE)
NEEDLE HYPO 25GX1X1/2 BEV (NEEDLE) ×3 IMPLANT
NEEDLE JAMSHIDI VIPER (NEEDLE) ×6 IMPLANT
NEEDLE SPNL 18GX3.5 QUINCKE PK (NEEDLE) ×6 IMPLANT
NS IRRIG 1000ML POUR BTL (IV SOLUTION) ×3 IMPLANT
OIL CARTRIDGE MAESTRO DRILL (MISCELLANEOUS) ×3
PACK LAMINECTOMY ORTHO (CUSTOM PROCEDURE TRAY) ×3 IMPLANT
PACK UNIVERSAL I (CUSTOM PROCEDURE TRAY) ×3 IMPLANT
PAD ARMBOARD 7.5X6 YLW CONV (MISCELLANEOUS) ×6 IMPLANT
PATTIES SURGICAL .5 X1 (DISPOSABLE) ×3 IMPLANT
PATTIES SURGICAL .5X1.5 (GAUZE/BANDAGES/DRESSINGS) ×3 IMPLANT
PROBE PEDCLE PROBE MAGSTM DISP (MISCELLANEOUS) ×2 IMPLANT
ROD VIPER II LORDOSED 5.5X40 (Rod) ×4 IMPLANT
SCREW POLY VIPER2 7X40MM (Screw) ×4 IMPLANT
SCREW SET SINGLE INNER MIS (Screw) ×8 IMPLANT
SCREW XTAB POLY VIPER  7X45 (Screw) ×4 IMPLANT
SCREW XTAB POLY VIPER 7X45 (Screw) IMPLANT
SPONGE INTESTINAL PEANUT (DISPOSABLE) ×1 IMPLANT
SPONGE SURGIFOAM ABS GEL 100 (HEMOSTASIS) ×3 IMPLANT
STRIP CLOSURE SKIN 1/2X4 (GAUZE/BANDAGES/DRESSINGS) ×3 IMPLANT
SURGIFLO W/THROMBIN 8M KIT (HEMOSTASIS) IMPLANT
SUT MNCRL AB 4-0 PS2 18 (SUTURE) ×5 IMPLANT
SUT VIC AB 0 CT1 18XCR BRD 8 (SUTURE) ×1 IMPLANT
SUT VIC AB 0 CT1 8-18 (SUTURE) ×6
SUT VIC AB 1 CT1 18XCR BRD 8 (SUTURE) ×1 IMPLANT
SUT VIC AB 1 CT1 8-18 (SUTURE) ×6
SUT VIC AB 2-0 CT2 18 VCP726D (SUTURE) ×7 IMPLANT
SYR 20CC LL (SYRINGE) ×6 IMPLANT
SYR BULB IRRIGATION 50ML (SYRINGE) ×3 IMPLANT
SYR CONTROL 10ML LL (SYRINGE) ×6 IMPLANT
SYR TB 1ML LUER SLIP (SYRINGE) ×1 IMPLANT
TAP CANN VIPER2 DL 6.0 (TAP) ×4 IMPLANT
TAPE CLOTH SURG 4X10 WHT LF (GAUZE/BANDAGES/DRESSINGS) ×2 IMPLANT
TRAY FOLEY W/METER SILVER 16FR (SET/KITS/TRAYS/PACK) ×1 IMPLANT
WATER STERILE IRR 1000ML POUR (IV SOLUTION) ×1 IMPLANT
YANKAUER SUCT BULB TIP NO VENT (SUCTIONS) ×3 IMPLANT

## 2017-10-21 NOTE — Progress Notes (Signed)
Patient requested 5 of his daily meds. He presented a document from the Surgeon's office instructing him to take those meds before surgery. All 5 meds administered this AM with 30cc of water. Pain meds administered for pain of 7 on a 0-10scale. Spouse is at his bedside. Will continue to monitor.

## 2017-10-21 NOTE — Anesthesia Preprocedure Evaluation (Signed)
Anesthesia Evaluation  Patient identified by MRN, date of birth, ID band Patient awake    Reviewed: Allergy & Precautions, NPO status   History of Anesthesia Complications Negative for: history of anesthetic complications  Airway Mallampati: II       Dental  (+) Partial Upper,    Pulmonary former smoker,    breath sounds clear to auscultation       Cardiovascular hypertension, Pt. on medications  Rhythm:Regular Rate:Normal  ECG: NSR, rate 97   Neuro/Psych Anxiety    GI/Hepatic negative GI ROS, Neg liver ROS,   Endo/Other  Hypothyroidism   Renal/GU negative Renal ROS     Musculoskeletal Left leg pain Neuropathy   Abdominal   Peds  Hematology   Anesthesia Other Findings HLD  Reproductive/Obstetrics                             Anesthesia Physical Anesthesia Plan  ASA: II  Anesthesia Plan: General   Post-op Pain Management:    Induction:   PONV Risk Score and Plan: 3 and Ondansetron, Dexamethasone and Treatment may vary due to age or medical condition  Airway Management Planned: Oral ETT  Additional Equipment:   Intra-op Plan:   Post-operative Plan: Extubation in OR  Informed Consent:   Dental advisory given  Plan Discussed with:   Anesthesia Plan Comments:         Anesthesia Quick Evaluation

## 2017-10-21 NOTE — Op Note (Signed)
NAME:  [name] [name], [name] [name]                  ACCOUNT NO.:  MEDICAL RECORD NO.:  [id number]  PHYSICIAN:  Estill BambergMark Acheron Sugg, MD      DATE OF BIRTH:  28-Apr-1961  DATE OF PROCEDURE:  10/20/2017                              OPERATIVE REPORT   PREOPERATIVE DIAGNOSES: 1. Left-sided L5-S1 neural foraminal stenosis. 2. Left-sided L5 radiculopathy. 3. Status post previous L5-S1 decompression and complete resolution of     pain, now with subsequent pain recurrence secondary to advancement     of L5-S1 degenerative disk disease.  POSTOPERATIVE DIAGNOSES: 1. Left-sided L5-S1 neural foraminal stenosis. 2. Left-sided L5 radiculopathy. 3. Status post previous L5-S1 decompression and complete resolution of     pain, now with subsequent pain recurrence secondary to advancement     of L5-S1 degenerative disk disease.  PROCEDURES: 1. Anterior lumbar interbody fusion, L5-S1. 2. Placement of anterior instrumentation securing the L5-S1     intervertebral implant. 3. Insertion of interbody device x1 (16 mm, large, 10 degrees, Kruger     intervertebral spacer. 4. Use of morselized allograft - ViviGen. 5. Intraoperative use of fluoroscopy.  SURGEON:  Estill BambergMark Ladavion Savitz, MD.  ASSISTANJason Coop:  Kayla McKenzie, PA-C.  ANESTHESIA:  General endotracheal anesthesia.  COMPLICATIONS:  None.  DISPOSITION:  Stable.  ESTIMATED BLOOD LOSS:  Minimal.  INDICATIONS FOR SURGERY:  Briefly, [name] [name] is a very pleasant 56- year-old male who is approximately 3 years status post a previous L5-S1 decompression.  The patient did very well from that surgery, but more recently, did have a recurrence of left leg pain, consistent with left L5 radiculopathy.  The patient's pain was rather constant and noted to be severe.  The patient's updated MRI did reveal neuroforaminal stenosis bilaterally.  The patient failed appropriate conservative treatment measures, and did wish to proceed with the procedure noted above.  OPERATIVE  DETAILS:  On October 20, 2017, the patient was brought to Surgery and general endotracheal anesthesia was administered.  The patient was placed supine on the hospital bed.  All bony prominences were padded.  The abdomen was prepped and draped in the usual sterile fashion.  A time-out procedure was then performed.  A retroperitoneal approach was then performed by Dr. Gretta Beganodd Early.  Once the L5-S1 intervertebral space was identified and the appropriate retractors were placed, Dr. Arbie CookeyEarly did scrub out of the case at which point I did perform a thorough and complete L5-S1 intervertebral diskectomy.  The diskectomy was carried to the posterior annulus.  The endplates were then appropriately prepared and the appropriate-sized intervertebral spacer was liberally packed with ViviGen and tamped into position in the usual fashion.  I was very pleased with the press-fit of the implant, and I was able to restore the height of the L5-S1 intervertebral space, thereby indirectly decompressing the bilateral neural foramina.  I then proceeded with the anterior instrumentation portion of the procedure. Osteophytes at the upper and anterior aspect of the S1 vertebral body were removed using a rongeur.  I then cannulated the superior and anterior aspect of the S1 vertebral body.  A screw was then placed into the S1 vertebral body, with an anterior fixation device secured to the back of the screw to help maintain the intervertebral implant in the appropriate position.  The wound was then copiously irrigated.  At  this point, additional AP and lateral fluoroscopic images were obtained, and I was very happy with the appearance of the images.  The fascia was then closed using #1 PDS.  The subcutaneous layer was closed using 0 Vicryl followed by 0 Vicryl and the skin was then closed using 4-0 Monocryl. Benzoin and Steri-Strips were applied followed by sterile dressing.  All instrument counts were correct at the  termination of the procedure.  Of note, Jason CoopKayla McKenzie was my assistant throughout surgery, and did aid in retraction, suctioning, and closure from start to finish.  Of note, the patient did report resolution of his left leg pain in the recovery room.    Estill BambergMark Lanard Arguijo, MD     MD/MEDQ  D:  10/20/2017  T:  10/20/2017  Job:  [id number]

## 2017-10-21 NOTE — H&P (Signed)
Patient tolerated stage 1 of his procedure well yesterday, and does report resolution of his left leg pain. Will proceed with stage 2 today as planned (L5/S1 PSF with instrumentation).

## 2017-10-21 NOTE — Anesthesia Procedure Notes (Signed)
Procedure Name: Intubation Date/Time: 10/21/2017 1:50 PM Performed by: Rosiland OzMeyers, Deric Bocock, CRNA Pre-anesthesia Checklist: Patient identified, Emergency Drugs available, Suction available, Patient being monitored and Timeout performed Patient Re-evaluated:Patient Re-evaluated prior to induction Oxygen Delivery Method: Circle system utilized Preoxygenation: Pre-oxygenation with 100% oxygen Induction Type: IV induction Ventilation: Mask ventilation without difficulty Laryngoscope Size: Miller and 3 Grade View: Grade I Tube type: Oral Tube size: 7.5 mm Number of attempts: 1 Airway Equipment and Method: Stylet Placement Confirmation: ETT inserted through vocal cords under direct vision,  positive ETCO2 and breath sounds checked- equal and bilateral Secured at: 23 cm Tube secured with: Tape Dental Injury: Teeth and Oropharynx as per pre-operative assessment

## 2017-10-21 NOTE — Progress Notes (Signed)
Patient ID: [name] [name], male   DOB: Jun 20, 1961, 56 y.o.   MRN: [id number] Comfortable this morning.  Reports dull ache in his back.  Milds incisional soreness. No nausea.  Has passed flatus. Abdomen soft mildly tender.  2+ dorsalis pedis pulse. Stable postop day #1.  Will not follow actively.  Please call if we can assist.

## 2017-10-21 NOTE — Transfer of Care (Signed)
Immediate Anesthesia Transfer of Care Note  Patient: [name] [name]  Procedure(s) Performed: LUMBAR 5-SACRUM 1 POSTERIOR LUMBAR FUSION WITH INSTRUMENTATION AND ALLOGRAFT; REQUEST 4 HOURS (N/A Spine Lumbar)  Patient Location: PACU  Anesthesia Type:General  Level of Consciousness: awake and patient cooperative  Airway & Oxygen Therapy: Patient Spontanous Breathing  Post-op Assessment: Report given to RN and Post -op Vital signs reviewed and stable  Post vital signs: Reviewed and stable  Last Vitals:  Vitals:   10/21/17 0447 10/21/17 0950  BP: 119/71 131/76  Pulse: 84 86  Resp: 18   Temp: 36.9 C 37.2 C  SpO2: 96% 94%    Last Pain:  Vitals:   10/21/17 1232  TempSrc:   PainSc: Asleep      Patients Stated Pain Goal: 0 (10/21/17 1211)  Complications: No apparent anesthesia complications

## 2017-10-21 NOTE — Progress Notes (Signed)
Pt. Arrived back on the floor at 1630 from PACU. Dressing is clean, dry, and intact. Vitals are stable. Will continue to monitor.

## 2017-10-21 NOTE — Anesthesia Postprocedure Evaluation (Signed)
Anesthesia Post Note  Patient: [name] [name]  Procedure(s) Performed: LUMBAR 5-SACRUM 1 POSTERIOR LUMBAR FUSION WITH INSTRUMENTATION AND ALLOGRAFT; REQUEST 4 HOURS (N/A Spine Lumbar)     Patient location during evaluation: PACU Anesthesia Type: General Pain management: pain level controlled Vital Signs Assessment: post-procedure vital signs reviewed and stable Respiratory status: spontaneous breathing Cardiovascular status: stable Anesthetic complications: no    Last Vitals:  Vitals:   10/21/17 1615 10/21/17 1623  BP: 131/81   Pulse: 88 86  Resp: 11 19  Temp:  36.7 C  SpO2: 98% 96%    Last Pain:  Vitals:   10/21/17 1611  TempSrc:   PainSc: 8                  Aum Caggiano

## 2017-10-22 NOTE — Progress Notes (Signed)
Pt. Is up with back brace on.  walking up and down the halls  Without a walker. He is doing well and felling well. Surgical sites dressings are dry, clean and tact.

## 2017-10-22 NOTE — Progress Notes (Signed)
Orthopedic Tech Progress Note Patient Details:  [name] [name] 06-29-1961 [id number]  Patient ID: [name] [name], male   DOB: 06-29-1961, 56 y.o.   MRN: [id number]   [name] FordyceJennifer C Martin [name] 10/22/2017, 10:04 AMCalled [employer] for TLSO brace.

## 2017-10-22 NOTE — Op Note (Signed)
NAME:  [name] [name], [name]                  ACCOUNT NO.:  MEDICAL RECORD NO.:  [id number]  LOCATION:                                 FACILITY:  PHYSICIAN:  Estill BambergMark Leandria Thier, MD      DATE OF BIRTH:  [birth year]  DATE OF PROCEDURE:  10/21/2017 DATE OF DISCHARGE:                              OPERATIVE REPORT   PREOPERATIVE DIAGNOSIS:  Status post anterior lumbar interbody fusion, L5-S1, requiring a posterior fusion with instrumentation.  POSTOPERATIVE DIAGNOSIS:  Status post anterior lumbar interbody fusion, L5-S1, requiring a posterior fusion with instrumentation.  PROCEDURES: 1. Posterior spinal fusion, L5-S1. 2. Placement of posterior instrumentation, L5-S1. 3. Use of morselized allograft. 4. Intraoperative use of fluoroscopy.  SURGEON:  Estill BambergMark Kaylen Motl, MD.  ASSISTANJason Coop:  Kayla McKenzie, PAC.  ANESTHESIA:  General endotracheal anesthesia.  COMPLICATIONS:  None.  DISPOSITION:  Stable.  ESTIMATED BLOOD LOSS:  Minimal.  INDICATIONS FOR SURGERY:  Briefly, Mr. [name] [name] is a pleasant 56 year old male who did undergo an anterior lumbar interbody fusion on October 20, 2017.  The plan was to proceed with stage 2, specifically, a posterior fusion with instrumentation.  Please refer my operative report on October 20, 2017, for full account of the patient's indications for surgery.  The patient did present today for stage 2 of his 2-stage procedure.  OPERATIVE DETAILS:  On October 21, 2017, the patient was brought to Surgery and general endotracheal anesthesia was administered.  The patient was placed prone on a well-padded flat Jackson bed with a spinal frame.  Antibiotics were given.  The patient was prepped and draped in the usual sterile fashion.  All bony prominences were padded.  At this point, intraoperative fluoroscopy was brought into the field and the L5 and S1 pedicles were marked out.  Paramedian incisions were then made lateral to the L5 and S1 pedicles.  On the left side,  I did expose the posterolateral gutter and the left-sided L5-S1 facet joint.  A high- speed bur was used to decorticate the posterolateral gutter and facet joint.  Allograft in the form of ViviGen was then packed into the posterolateral gutter and left L5-S1 facet joint, to help aid in the success of the fusion.  I then advanced Jamshidi across the L5 and S1 pedicles bilaterally, liberally using AP and lateral fluoroscopy.  I then advanced guidewires through the Jamshidi needles.  I then used a 6- mm tap to tap the L5 and S1 pedicles.  I did use neurologic monitoring and I did use triggered EMG to test each of the taps, and there was no tap that tested below 20 milliamps.  I then placed 7 x 45 mm screws at L5 and 7 x 40 mm screws at S1 bilaterally.  A 40-mm rod was secured into the tulip heads of the screws in a subfascial fashion.  Caps were then placed and a final locking procedure was performed.  I was very pleased with the final AP and lateral fluoroscopic images.  The wound was then copiously irrigated.  The wounds were then closed in layers using #1 Vicryl followed by 2-0 Vicryl followed by 4-0 Monocryl.  Benzoin and Steri-Strips were applied followed  by sterile dressing.  All instrument counts were correct at the termination of the procedure.  Of note, Jason CoopKayla McKenzie was my assistant throughout surgery, and did aid in retraction, suctioning, and closure from start to finish.     Estill BambergMark Mialani Reicks, MD     MD/MEDQ  D:  10/21/2017  T:  10/21/2017  Job:  [id number]

## 2017-10-22 NOTE — Progress Notes (Signed)
Patient having minimal pain ambulated twice tonight with brace only, did not want to use walker, dressing are clean dry and intact, will continue to monitor.

## 2017-10-22 NOTE — Progress Notes (Signed)
This RN went over discharge with Pt. He verbalized understanding of discharge. All questions and concerns addressed. Pt. Discharged home with all his belongings and taken down in a wheelchair.

## 2017-10-22 NOTE — Care Management Note (Signed)
Case Management Note  Patient Details  Name: [name] [name] MRN: [id number] Date of Birth: 1961-03-15  Subjective/Objective:                    Action/Plan: Pt discharging home with self care. Pt has hospital f/u, insurance and transportation home. No further needs per CM.  Expected Discharge Date:  10/22/17               Expected Discharge Plan:  Home/Self Care  In-House Referral:     Discharge planning Services     Post Acute Care Choice:    Choice offered to:     DME Arranged:    DME Agency:     HH Arranged:    HH Agency:     Status of Service:  Completed, signed off  If discussed at [employer] Length of Stay Meetings, dates discussed:    Additional Comments:  Kermit BaloKelli F Cylee Dattilo, RN 10/22/2017, 11:45 AM

## 2017-10-22 NOTE — Progress Notes (Signed)
    Patient doing great, he has ambulated twice overnight without the walker and is tolerating his brace well. He denies any leg pain, resolved buttock and foot pain, minimal LBP about the incision. He has been eating and drinking and NL B/B functions. Tolerating meds well PRN   Physical Exam: BP 124/77 (BP Location: Left Arm)   Pulse 84   Temp 98.7 F (37.1 C) (Oral)   Resp 18   Ht 6' 3.5" (1.918 m)   Wt 112.9 kg (249 lb)   SpO2 97%   BMI 30.71 kg/m   Dressing in place, CDI, TLSO brace adjusted and fits appropriately, pt up and walking and looks comfortable  NVI  POD #2/1 s/p ANT/POST fusion with resolved radiculopathy and minimal PO LBP doing wonderfully   - up with PT/OT, encourage ambulation  -D/C after PT if cleared  - Percocet for pain, Valium for muscle spasms  -Written scripts printed and signed in chart  - likely d/c home today after PT  -D/C instructions printed and in chart  - F/U in office in 2 weeks as scheduled

## 2017-10-22 NOTE — Evaluation (Signed)
Physical Therapy Evaluation Patient Details Name: [name] [name] MRN: [id number] DOB: 1961/12/11 Today's Date: 10/22/2017   History of Present Illness  [name] [name] is a 56 y.o. male who presents with ongoing pain in the left leg. s/p anterior posterior lumbar fusion L5-S1, radicular pain resolved. PMH for HTN and hypothyroidism  Clinical Impression  Patient evaluated by Physical Therapy with no further acute PT needs identified. All education has been completed and the patient has no further questions. Pt has no need of follow-up Physical Therapy or equipment. PT is signing off. Thank you for this referral.     Follow Up Recommendations No PT follow up    Equipment Recommendations  None recommended by PT    Recommendations for Other Services       Precautions / Restrictions Precautions Precautions: Back Precaution Comments: (reviewed precautions, pt able to recall 3/3) Required Braces or Orthoses: Spinal Brace Spinal Brace: Thoracolumbosacral orthotic;Applied in sitting position Restrictions Weight Bearing Restrictions: No      Mobility  Bed Mobility               General bed mobility comments: OOB at entry  Transfers Overall transfer level: Independent Equipment used: None                Ambulation/Gait Ambulation/Gait assistance: Independent Ambulation Distance (Feet): 300 Feet Assistive device: None Gait Pattern/deviations: WFL(Within Functional Limits) Gait velocity: WFL Gait velocity interpretation: at or above normal speed for age/gender General Gait Details: strong, steady gait, no instability   Stairs Stairs: Yes Stairs assistance: Independent Stair Management: Two rails;Forwards Number of Stairs: 10 General stair comments: good upright posture, no difficulties      Balance Overall balance assessment: No apparent balance deficits (not formally assessed)                                           Pertinent  Vitals/Pain Pain Assessment: 0-10 Pain Score: 5  Pain Location: inscision site,  Pain Descriptors / Indicators: Grimacing;Guarding Pain Intervention(s): Monitored during session    Home Living Family/patient expects to be discharged to:: Private residence Living Arrangements: Spouse/significant other Available Help at Discharge: Family;Available 24 hours/day Type of Home: House Home Access: Stairs to enter   Enter[employer] CorporationEntrance Stairs-Number of Steps: 2 Home Layout: One level Home Equipment: Environmental consultantWalker - 2 wheels      Prior Function Level of Independence: Independent         Comments: community ambulator, 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 [place]      [employer] Dominance        Extremity/Trunk Assessment   Upper Extremity Assessment Upper Extremity Assessment: Overall WFL for tasks assessed    Lower Extremity Assessment Lower Extremity Assessment: Overall WFL for tasks assessed       Communication   Communication: No difficulties  Cognition Arousal/Alertness: Awake/alert Behavior During Therapy: WFL for tasks assessed/performed Overall Cognitive Status: Within Functional Limits for tasks assessed                                               Assessment/Plan    PT Assessment Patent does not need any further PT services         PT Goals (Current goals can be found in the Care Plan  section)  Acute Rehab PT Goals Patient Stated Goal: go home PT Goal Formulation: With patient     AM-PAC PT "6 Clicks" Daily Activity  Outcome Measure Difficulty turning over in bed (including adjusting bedclothes, sheets and blankets)?: None Difficulty moving from lying on back to sitting on the side of the bed? : None Difficulty sitting down on and standing up from a chair with arms (e.g., wheelchair, bedside commode, etc,.)?: None Help needed moving to and from a bed to chair (including a wheelchair)?: None Help needed walking in hospital room?: None Help needed climbing  3-5 steps with a railing? : None 6 Click Score: 24    End of Session Equipment Utilized During Treatment: Back brace Activity Tolerance: Patient tolerated treatment well Patient left: Other (comment)(walking in hallway ) Nurse Communication: Mobility status PT Visit Diagnosis: Pain Pain - part of body: (back inscisional site)    Time: 1002-1020 PT Time Calculation (min) (ACUTE ONLY): 18 min   Charges:   PT Evaluation $PT Eval Low Complexity: 1 Low     PT G Codes:        [name] [name]. [name] [name] PT, DPT Acute Rehabilitation  [phone] Pager [phone]    [name] [name] 10/22/2017, 11:41 AM

## 2017-10-27 NOTE — Discharge Summary (Signed)
Patient ID: [name] [name] MRN: [id number] DOB/AGE: 08/22/1961 56 y.o.  Admit date: 10/20/2017 Discharge date: 10/22/2017  Admission Diagnoses:  Active Problems:   Radiculopathy   Discharge Diagnoses:  Same  Past Medical History:  Diagnosis Date  . Anxiety   . Hypertension   . Hypothyroidism   . Neuropathy     left foot  . Sleep apnea     Surgeries: Procedure(s): DAY 1 ANTERIOR LUMBAR DECOMPRESSION AND FUSION, DAY 2 LUMBAR 5-SACRUM 1 POSTERIOR LUMBAR FUSION WITH INSTRUMENTATION AND ALLOGRAFT; REQUEST 4 HOURS on 10/21/2017   Consultants: Vascular surgery for abdominal exposure  Discharged Condition: Improved  Hospital Course: [name] [name] is an 56 y.o. male who was admitted 10/20/2017 for operative treatment of radiculopathy. Patient has severe unremitting pain that affects sleep, daily activities, and work/hobbies. After pre-op clearance the patient was taken to the operating room on 10/21/2017 and underwent  Procedure(s): Anterior L5-S1 decompression and fusion day 1 gollowed by LUMBAR 5-SACRUM 1 POSTERIOR LUMBAR FUSION WITH INSTRUMENTATION AND ALLOGRAFT; REQUEST 4 HOURS.    Patient was given perioperative antibiotics:  Anti-infectives (From admission, onward)   Start     Dose/Rate Route Frequency Ordered Stop   10/21/17 1100  ceFAZolin (ANCEF) IVPB 2g/100 mL premix     2 g 200 mL/hr over 30 Minutes Intravenous On call to O.R. 10/20/17 2326 10/21/17 1353   10/20/17 2000  ceFAZolin (ANCEF) IVPB 2g/100 mL premix     2 g 200 mL/hr over 30 Minutes Intravenous Every 8 hours 10/20/17 1858 10/21/17 0500   10/20/17 0920  ceFAZolin (ANCEF) IVPB 2g/100 mL premix     2 g 200 mL/hr over 30 Minutes Intravenous On call to O.R. 10/20/17 0920 10/20/17 1318       Patient was given sequential compression devices, early ambulation to prevent DVT.  Patient benefited maximally from hospital stay and there were no complications.    Recent vital signs: BP 131/86 (BP Location:  Left Arm)   Pulse 100   Temp 98.9 F (37.2 C) (Oral)   Resp 18   Ht 6' 3.5" (1.918 m)   Wt 112.9 kg (249 lb)   SpO2 96%   BMI 30.71 kg/m    Discharge Medications:   Allergies as of 10/22/2017   No Known Allergies     Medication List    TAKE these medications   ALPRAZolam 0.5 MG tablet Commonly known as:  XANAX Take 0.25 mg by mouth 2 (two) times daily as needed for sleep. Notes to patient:  Follow Home sechedule   atorvastatin 20 MG tablet Commonly known as:  LIPITOR Take 20 mg by mouth daily.   buPROPion 300 MG 24 hr tablet Commonly known as:  WELLBUTRIN XL Take 300 mg by mouth daily.   ciprofloxacin-hydrocortisone OTIC suspension Commonly known as:  CIPRO HC OTIC Place 3 drops into both ears 2 (two) times daily. Notes to patient:  Follow Home sechedule  None given today   clindamycin 1 % gel Commonly known as:  CLINDAGEL Apply 1 application topically 2 (two) times daily. Notes to patient:  Follow Home schedule    cycloSPORINE 0.05 % ophthalmic emulsion Commonly known as:  RESTASIS Place 1 drop into both eyes 2 (two) times daily.   diazepam 5 MG tablet Commonly known as:  VALIUM Take 5 mg by mouth every 6 (six) hours as needed for muscle spasms.   levothyroxine 200 MCG tablet Commonly known as:  SYNTHROID, LEVOTHROID Take 200 mcg by mouth daily before  breakfast.   lisinopril-hydrochlorothiazide 20-12.5 MG tablet Commonly known as:  PRINZIDE,ZESTORETIC Take 1 tablet by mouth daily.   montelukast 10 MG tablet Commonly known as:  SINGULAIR Take 10 mg by mouth daily.   oxyCODONE-acetaminophen 5-325 MG tablet Commonly known as:  PERCOCET/ROXICET Take 1 tablet by mouth every 6 (six) hours as needed for severe pain.   pregabalin 300 MG capsule Commonly known as:  LYRICA Take 300 mg by mouth 2 (two) times daily as needed (pain).       Diagnostic Studies: Dg Chest 2 View  Result Date: 10/13/2017 CLINICAL DATA:  56 year old male preoperative study  for lumbar spine surgery. EXAM: CHEST  2 VIEW COMPARISON:  10/24/2014. FINDINGS: Lung volumes are stable and within normal limits. Normal cardiac size and mediastinal contours. Visualized tracheal air column is within normal limits. Lung parenchyma is stable in clear. No pneumothorax or pleural effusion. Endplate osteophytes in the mid and lower thoracic spine. No acute osseous abnormality identified. Negative visible bowel gas pattern. IMPRESSION: Negative.  No acute cardiopulmonary abnormality. Electronically Signed   By: Odessa FlemingH  Hall M.D.   On: 10/13/2017 07:12   Dg Lumbar Spine 2-3 Views  Result Date: 10/21/2017 CLINICAL DATA:  L5-S1 fusion. EXAM: DG C-ARM 61-120 MIN; LUMBAR SPINE - 2-3 VIEW COMPARISON:  10/20/2017 FINDINGS: Vertebral body alignment and heights are normal. There is mild to moderate spondylosis of the lumbar spine. No change in a screw along the superior aspect of S1 extending from anterior to posterior. L5-S1 interbody fusion. Posterior fusion hardware with bilateral pedicle screws at the L5-S1 level intact. IMPRESSION: Posterior fusion hardware intact at the L5-S1 level. Single screw along the superior aspect of S1 from anterior posterior intact and unchanged. Mild to moderate spondylosis of the lumbar spine. Electronically Signed   By: Elberta Fortisaniel  Boyle M.D.   On: 10/21/2017 15:29   Dg Lumbar Spine 2-3 Views  Result Date: 10/20/2017 CLINICAL DATA:  L5-S1 ALIF EXAM: LUMBAR SPINE - 2-3 VIEW; DG C-ARM 61-120 MIN COMPARISON:  Intraoperative lumbar spine radiographs of 10/24/2014 FINDINGS: Prior exam labeled with 5 lumbar vertebra. Disc prosthesis identified at L5-S1. Anterior screw present at S1. Disc space narrowing and endplate spur formation at L4-L5. Bones appear demineralized. Surgical instrument/retractors project over sacrum and L5-S1 disc space. IMPRESSION: Surgical changes at L5-S1 as above. Electronically Signed   By: Ulyses SouthwardMark  Boles M.D.   On: 10/20/2017 15:29   Dg C-arm 1-60 Min  Result  Date: 10/21/2017 CLINICAL DATA:  L5-S1 fusion. EXAM: DG C-ARM 61-120 MIN; LUMBAR SPINE - 2-3 VIEW COMPARISON:  10/20/2017 FINDINGS: Vertebral body alignment and heights are normal. There is mild to moderate spondylosis of the lumbar spine. No change in a screw along the superior aspect of S1 extending from anterior to posterior. L5-S1 interbody fusion. Posterior fusion hardware with bilateral pedicle screws at the L5-S1 level intact. IMPRESSION: Posterior fusion hardware intact at the L5-S1 level. Single screw along the superior aspect of S1 from anterior posterior intact and unchanged. Mild to moderate spondylosis of the lumbar spine. Electronically Signed   By: Elberta Fortisaniel  Boyle M.D.   On: 10/21/2017 15:29   Dg C-arm 1-60 Min  Result Date: 10/20/2017 CLINICAL DATA:  L5-S1 ALIF EXAM: LUMBAR SPINE - 2-3 VIEW; DG C-ARM 61-120 MIN COMPARISON:  Intraoperative lumbar spine radiographs of 10/24/2014 FINDINGS: Prior exam labeled with 5 lumbar vertebra. Disc prosthesis identified at L5-S1. Anterior screw present at S1. Disc space narrowing and endplate spur formation at L4-L5. Bones appear demineralized. Surgical instrument/retractors project over  sacrum and L5-S1 disc space. IMPRESSION: Surgical changes at L5-S1 as above. Electronically Signed   By: Ulyses SouthwardMark  Boles M.D.   On: 10/20/2017 15:29   Dg Or Local Abdomen  Result Date: 10/20/2017 CLINICAL DATA:  Instrument count.  Status post L5-S1 fixation. EXAM: OR LOCAL ABDOMEN COMPARISON:  None. FINDINGS: Single AP view of the pelvis is provided. Fixation screw and overlying disc spacer noted at the L5-S1 level. Small presumed surgical clips are seen overlying the left sacrum. No additional radiodense foreign body identified. IMPRESSION: No evidence of retained instrument. Findings discussed with the OR staff at 3:25 p.m. Electronically Signed   By: Bary RichardStan  Maynard M.D.   On: 10/20/2017 15:27    Disposition: 01-Home or Self Care  Discharge Instructions    Discharge  patient   Complete by:  As directed    Discharge disposition:  01-Home or Self Care   Discharge patient date:  10/22/2017     POD #2/1 s/p ANT/POST fusion with resolved radiculopathy and minimal PO LBP doing wonderfully   - up with PT/OT, encourage ambulation             -D/C after PT if cleared - Percocet for pain, Valium for muscle spasms             -Written scripts printed and signed in chart  - likely d/c home today after PT             -D/C instructions printed and in chart  - F/U in office in 2 weeks as scheduled             Signed: Georga BoraMCKENZIE, Nasiah Lehenbauer J 10/27/2017, 11:42 AM

## 2019-05-18 IMAGING — RF DG LUMBAR SPINE 2-3V
1 series · 2 of 2 positions shown · non-contrast
Comparison: 10/20/2017

CLINICAL DATA: L5-S1 fusion.

EXAM:
DG C-ARM 61-120 MIN; LUMBAR SPINE - 2-3 VIEW

[Series 1: run · 2 of 2 slices shown]
[im 1/2]
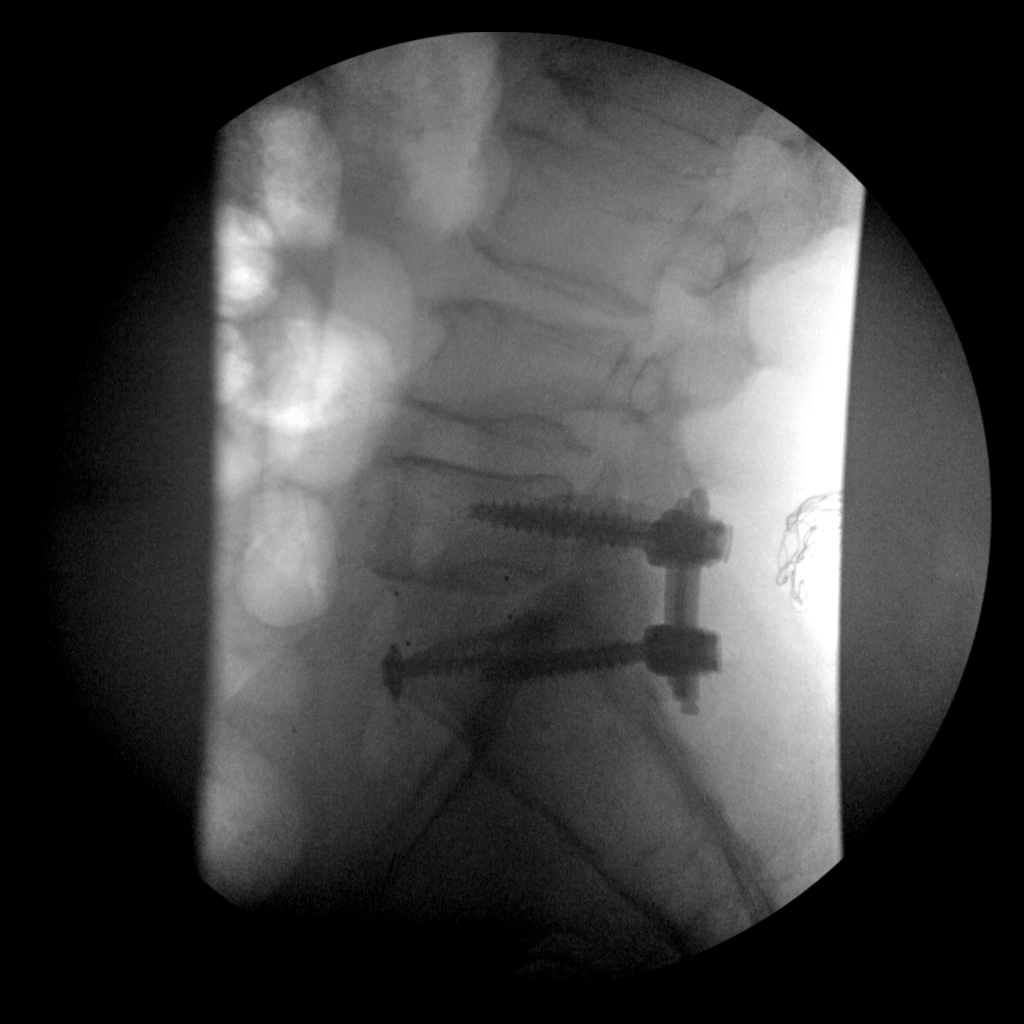
[im 2/2]
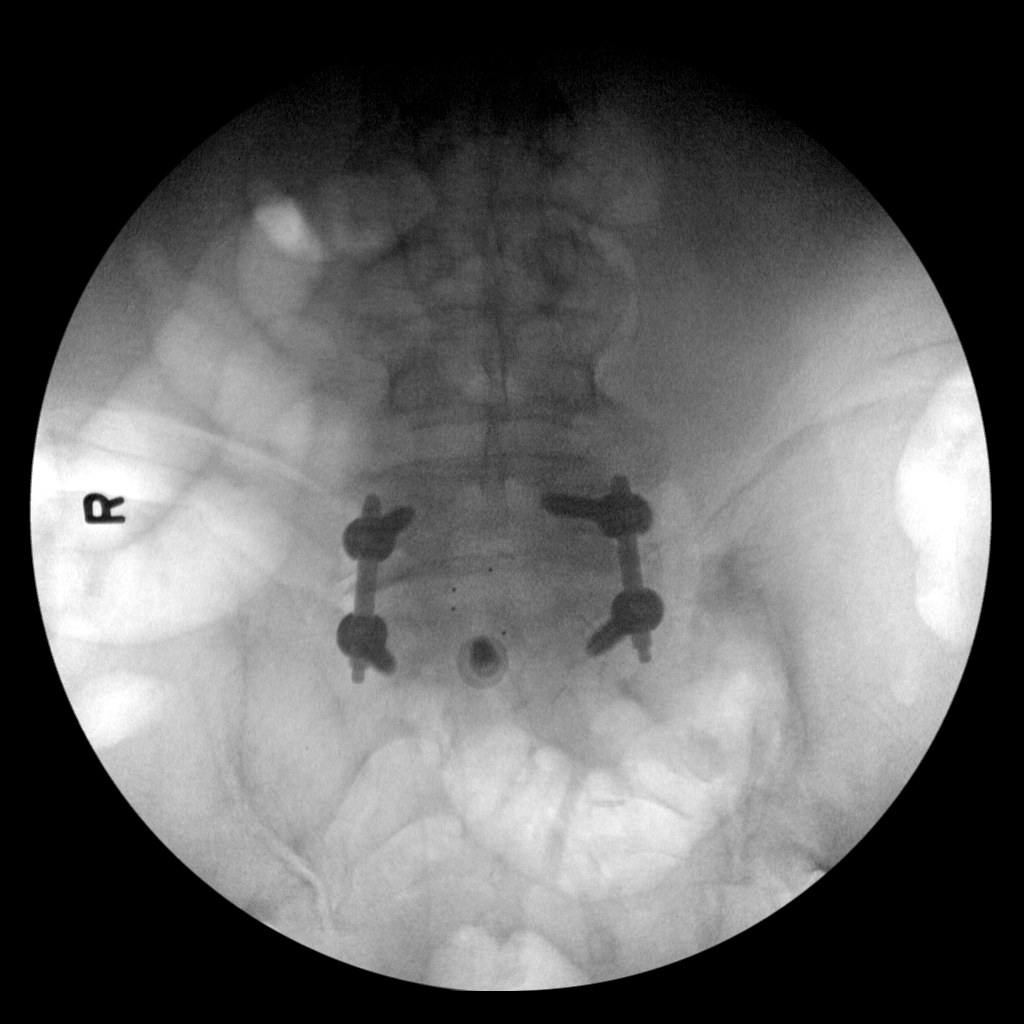

[2 of 2 positions shown; findings below may reference images not displayed]

FINDINGS: Vertebral body alignment and heights are normal. There is mild to
moderate spondylosis of the lumbar spine. No change in a screw along
the superior aspect of S1 extending from anterior to posterior.
L5-S1 interbody fusion. Posterior fusion hardware with bilateral
pedicle screws at the L5-S1 level intact.
IMPRESSION: Posterior fusion hardware intact at the L5-S1 level. Single screw
along the superior aspect of S1 from anterior posterior intact and
unchanged.

Mild to moderate spondylosis of the lumbar spine.

## 2022-07-29 ENCOUNTER — Other Ambulatory Visit: Payer: Self-pay | Admitting: Orthopaedic Surgery

## 2022-07-30 NOTE — Progress Notes (Signed)
Pt. Needs orders for surgery. 

## 2022-07-30 NOTE — Patient Instructions (Addendum)
DUE TO COVID-19 ONLY TWO VISITORS  (aged 62 and older)  ARE ALLOWED TO COME WITH YOU AND STAY IN THE WAITING ROOM ONLY DURING PRE OP AND PROCEDURE.   **NO VISITORS ARE ALLOWED IN THE SHORT STAY AREA OR RECOVERY ROOM!!**  IF YOU WILL BE ADMITTED INTO THE HOSPITAL YOU ARE ALLOWED ONLY FOUR SUPPORT PEOPLE DURING VISITATION HOURS ONLY (7 AM -8PM)   The support person(s) must pass our screening, gel in and out, and wear a mask at all times, including in the patient's room. Patients must also wear a mask when staff or their support person are in the room. Visitors GUEST BADGE MUST BE WORN VISIBLY  One adult visitor may remain with you overnight and MUST be in the room by 8 P.M.     Your procedure is scheduled on: 08/04/22   Report to [facility] Main Entrance    Report to admitting at : 10:00 AM   Call this number if you have problems the morning of surgery [phone]   Do not eat food :After Midnight.   After Midnight you may have the following liquids until : 9:30 AM DAY OF SURGERY  Water Black Coffee (sugar ok, NO MILK/CREAM OR CREAMERS)  Tea (sugar ok, NO MILK/CREAM OR CREAMERS) regular and decaf                             Plain Jell-O (NO RED)                                           Fruit ices (not with fruit pulp, NO RED)                                     Popsicles (NO RED)                                                                  Juice: apple, WHITE grape, WHITE cranberry Sports drinks like Gatorade (NO RED)               Oral Hygiene is also important to reduce your risk of infection.                                    Remember - BRUSH YOUR TEETH THE MORNING OF SURGERY WITH YOUR REGULAR TOOTHPASTE   Do NOT smoke after Midnight   Take these medicines the morning of surgery with A SIP OF WATER: bupropion,synthroid,montelukast.Use eye drops as usual.Tylenol as needed.  DO NOT TAKE ANY ORAL DIABETIC MEDICATIONS DAY OF YOUR SURGERY  Bring CPAP mask and  tubing day of surgery.                              You may not have any metal on your body including hair pins, jewelry, and body piercing  Do not wear lotions, powders, perfumes/cologne, or deodorant              Men may shave face and neck.   Do not bring valuables to the hospital. [facility] IS NOT             RESPONSIBLE   FOR VALUABLES.   Contacts, dentures or bridgework may not be worn into surgery.   Bring small overnight bag day of surgery.   DO NOT BRING YOUR HOME MEDICATIONS TO THE HOSPITAL. PHARMACY WILL DISPENSE MEDICATIONS LISTED ON YOUR MEDICATION LIST TO YOU DURING YOUR ADMISSION IN THE HOSPITAL!    Patients discharged on the day of surgery will not be allowed to drive home.  Someone NEEDS to stay with you for the first 24 hours after anesthesia.   Special Instructions: Bring a copy of your healthcare power of attorney and living will documents         the day of surgery if you haven't scanned them before.              Please read over the following fact sheets you were given: IF YOU HAVE QUESTIONS ABOUT YOUR PRE-OP INSTRUCTIONS PLEASE CALL [phone]     [facility] - Preparing for Surgery Before surgery, you can play an important role.  Because skin is not sterile, your skin needs to be as free of germs as possible.  You can reduce the number of germs on your skin by washing with CHG (chlorahexidine gluconate) soap before surgery.  CHG is an antiseptic cleaner which kills germs and bonds with the skin to continue killing germs even after washing. Please DO NOT use if you have an allergy to CHG or antibacterial soaps.  If your skin becomes reddened/irritated stop using the CHG and inform your nurse when you arrive at Short Stay. Do not shave (including legs and underarms) for at least 48 hours prior to the first CHG shower.  You may shave your face/neck. Please follow these instructions carefully:  1.  Shower with CHG Soap the night before surgery and the   morning of Surgery.  2.  If you choose to wash your hair, wash your hair first as usual with your  normal  shampoo.  3.  After you shampoo, rinse your hair and body thoroughly to remove the  shampoo.                           4.  Use CHG as you would any other liquid soap.  You can apply chg directly  to the skin and wash                       Gently with a scrungie or clean washcloth.  5.  Apply the CHG Soap to your body ONLY FROM THE NECK DOWN.   Do not use on face/ open                           Wound or open sores. Avoid contact with eyes, ears mouth and genitals (private parts).                       Wash face,  Genitals (private parts) with your normal soap.             6.  Wash thoroughly, paying special attention to the area where your surgery  will be performed.  7.  Thoroughly rinse your body with warm water from the neck down.  8.  DO NOT shower/wash with your normal soap after using and rinsing off  the CHG Soap.                9.  Pat yourself dry with a clean towel.            10.  Wear clean pajamas.            11.  Place clean sheets on your bed the night of your first shower and do not  sleep with pets. Day of Surgery : Do not apply any lotions/deodorants the morning of surgery.  Please wear clean clothes to the hospital/surgery center.  FAILURE TO FOLLOW THESE INSTRUCTIONS MAY RESULT IN THE CANCELLATION OF YOUR SURGERY PATIENT SIGNATURE_________________________________  NURSE SIGNATURE__________________________________  ________________________________________________________________________

## 2022-07-31 ENCOUNTER — Other Ambulatory Visit: Payer: Self-pay

## 2022-07-31 ENCOUNTER — Encounter (HOSPITAL_COMMUNITY)
Admission: RE | Admit: 2022-07-31 | Discharge: 2022-07-31 | Disposition: A | Discharge status: Home / Self Care | Payer: 59 | Source: Ambulatory Visit | Attending: Orthopaedic Surgery | Admitting: Orthopaedic Surgery

## 2022-07-31 ENCOUNTER — Encounter (HOSPITAL_COMMUNITY): Payer: Self-pay

## 2022-07-31 VITALS — BP 147/92 | HR 83 | Temp 97.9°F | Ht 75.0 in | Wt 242.0 lb

## 2022-07-31 DIAGNOSIS — I2510 Atherosclerotic heart disease of native coronary artery without angina pectoris: Secondary | ICD-10-CM | POA: Diagnosis not present

## 2022-07-31 DIAGNOSIS — Z01818 Encounter for other preprocedural examination: Secondary | ICD-10-CM | POA: Diagnosis present

## 2022-07-31 HISTORY — DX: Unspecified osteoarthritis, unspecified site: M19.90

## 2022-07-31 LAB — CBC
HCT: 40.6 % (ref 39.0–52.0)
Hemoglobin: 13.4 g/dL (ref 13.0–17.0)
MCH: 30.4 pg (ref 26.0–34.0)
MCHC: 33 g/dL (ref 30.0–36.0)
MCV: 92.1 fL (ref 80.0–100.0)
Platelets: 342 K/uL (ref 150–400)
RBC: 4.41 MIL/uL (ref 4.22–5.81)
RDW: 13.2 % (ref 11.5–15.5)
WBC: 10.7 K/uL — ABNORMAL HIGH (ref 4.0–10.5)
nRBC: 0 % (ref 0.0–0.2)

## 2022-07-31 LAB — SURGICAL PCR SCREEN
MRSA, PCR: NEGATIVE
Staphylococcus aureus: NEGATIVE

## 2022-07-31 NOTE — Progress Notes (Signed)
For Short Stay: COVID SWAB appointment date: Date of COVID positive in last 90 days:  Bowel Prep reminder:   For Anesthesia: PCP - Vertell Limber: [place] Cardiologist - Dr. Marcha Solders. LOV: 11/10/21  Chest x-ray -  EKG -  Stress Test -  ECHO - 11/10/21 Cardiac Cath -  Pacemaker/ICD device last checked: Pacemaker orders received: Device Rep notified:  Spinal Cord Stimulator:  Sleep Study -  CPAP -   Fasting Blood Sugar -  Checks Blood Sugar _____ times a day Date and result of last Hgb A1c-  Blood Thinner Instructions: Aspirin Instructions: Last Dose:  Activity level: Can go up a flight of stairs and activities of daily living without stopping and without chest pain and/or shortness of breath   Able to exercise without chest pain and/or shortness of breath   Unable to go up a flight of stairs without chest pain and/or shortness of breath     Anesthesia review: Hx: HTN,OSA(NO CPAP),CAD  Patient denies shortness of breath, fever, cough and chest pain at PAT appointment   Patient verbalized understanding of instructions that were given to them at the PAT appointment. Patient was also instructed that they will need to review over the PAT instructions again at home before surgery.

## 2022-08-03 NOTE — H&P (Signed)
TOTAL KNEE ADMISSION H&P  Patient is being admitted for left total knee arthroplasty.  Subjective:  Chief Complaint:left knee pain.  HPI: [name] [name], 61 y.o. male, has a history of pain and functional disability in the left knee due to arthritis and has failed non-surgical conservative treatments for greater than 12 weeks to includeNSAID's and/or analgesics, corticosteriod injections, viscosupplementation injections, flexibility and strengthening excercises, supervised PT with diminished ADL's post treatment, use of assistive devices, weight reduction as appropriate, and activity modification.  Onset of symptoms was gradual, starting 5 years ago with gradually worsening course since that time. The patient noted prior procedures on the knee to include  arthroscopy on the left knee(s).  Patient currently rates pain in the left knee(s) at 10 out of 10 with activity. Patient has night pain, worsening of pain with activity and weight bearing, pain that interferes with activities of daily living, crepitus, and joint swelling.  Patient has evidence of subchondral cysts, subchondral sclerosis, periarticular osteophytes, and joint space narrowing by imaging studies. There is no active infection.  Patient Active Problem List   Diagnosis Date Noted   Radiculopathy 10/20/2017   Past Medical History:  Diagnosis Date   Anxiety    Arthritis    Hypertension    Hypothyroidism    Neuropathy     left foot   Sleep apnea     Past Surgical History:  Procedure Laterality Date   ABDOMINAL EXPOSURE N/A 10/20/2017   Procedure: ABDOMINAL EXPOSURE;  Surgeon: Larina Earthly, MD;  Location: [facility] OR;  Service: Vascular;  Laterality: N/A;   ANTERIOR LUMBAR FUSION N/A 10/20/2017   Procedure: LUMBAR 5-SACRUM 1 ANTERIOR LUMBAR INTERBODY FUSION WITH INSTRUMENTATION AND ALLOGRAFT; REQUEST 3 HOURS;  Surgeon: Estill Bamberg, MD;  Location: [facility] OR;  Service: Orthopedics;  Laterality: N/A;  LUMBAR 5-SACRUM 1 ANTERIOR LUMBAR  INTERBODY FUSION WITH INSTRUMENTATION AND ALLOGRAFT; REQUEST 3 HOURS   COLONOSCOPY     IRRIGATION AND DEBRIDEMENT SHOULDER Left 09/04/2016   Procedure: IRRIGATION AND DEBRIDEMENT SHOULDER;  Surgeon: Marcene Corning, MD;  Location: [facility];  Service: Orthopedics;  Laterality: Left;   KNEE ARTHROSCOPY Bilateral 5-10 years ago   both knees   LUMBAR LAMINECTOMY/DECOMPRESSION MICRODISCECTOMY N/A 10/24/2014   Procedure: LUMBAR LAMINECTOMY/DECOMPRESSION MICRODISCECTOMY;  Surgeon: Emilee Hero, MD;  Location: [facility] OR;  Service: Orthopedics;  Laterality: N/A;  Lumbar 5-sacrum 1 decompression   OPEN ANTERIOR SHOULDER RECONSTRUCTION Right 10 years ago   SHOULDER ARTHROSCOPY  5-10 years ago   both shoulder   VARICOSE VEIN SURGERY Bilateral 4 yrs ago     No current facility-administered medications for this encounter.   Current Outpatient Medications  Medication Sig Dispense Refill Last Dose   acetaminophen (TYLENOL) 500 MG tablet Take 1,000 mg by mouth every 6 (six) hours as needed for moderate pain.      ALPRAZolam (XANAX) 0.5 MG tablet Take 0.25 mg by mouth daily as needed for sleep or anxiety.      aspirin 81 MG chewable tablet Chew 81 mg by mouth daily.      atorvastatin (LIPITOR) 20 MG tablet Take 20 mg by mouth daily.      buPROPion (WELLBUTRIN XL) 300 MG 24 hr tablet Take 300 mg by mouth daily.      celecoxib (CELEBREX) 200 MG capsule Take 200 mg by mouth 2 (two) times daily.      ciprofloxacin-hydrocortisone (CIPRO HC OTIC) OTIC suspension Place 3 drops into both ears daily as needed (ear ache).  clindamycin (CLINDAGEL) 1 % gel Apply 1 application  topically daily as needed (facial blemish).      clobetasol cream (TEMOVATE) 0.05 % Apply 1 Application topically 4 (four) times a week.      cycloSPORINE (RESTASIS) 0.05 % ophthalmic emulsion Place 1 drop into both eyes 2 (two) times daily.      diclofenac (FLECTOR) 1.3 % PTCH Place 2 patches onto the skin daily as needed  (pain).      diclofenac Sodium (VOLTAREN) 1 % GEL Apply 1 Application topically 4 (four) times daily as needed for pain.      levothyroxine (SYNTHROID) 25 MCG tablet Take 25 mcg by mouth daily before breakfast.      levothyroxine (SYNTHROID, LEVOTHROID) 200 MCG tablet Take 200 mcg by mouth daily before breakfast.      lidocaine (LIDODERM) 5 % Place 2 patches onto the skin daily as needed for pain.      lisinopril-hydrochlorothiazide (PRINZIDE,ZESTORETIC) 20-12.5 MG per tablet Take 1 tablet by mouth daily.      montelukast (SINGULAIR) 10 MG tablet Take 10 mg by mouth daily.      No Known Allergies  Social History   Tobacco Use   Smoking status: Former    Types: Cigarettes    Quit date: 2003    Years since quitting: 20.6   Smokeless tobacco: Never   Tobacco comments:    was a light smoker maybe 3 a week  Substance Use Topics   Alcohol use: No    Family History  Problem Relation Age of Onset   Diabetes Mother    Diabetes Brother    Chronic Renal Failure Brother      Review of Systems  Musculoskeletal:  Positive for arthralgias.       Left knee  All other systems reviewed and are negative.   Objective:  Physical Exam Constitutional:      Appearance: Normal appearance.  HENT:     Head: Normocephalic and atraumatic.     Nose: Nose normal.     Mouth/Throat:     Pharynx: Oropharynx is clear.  Eyes:     Extraocular Movements: Extraocular movements intact.  Cardiovascular:     Rate and Rhythm: Normal rate.  Pulmonary:     Effort: Pulmonary effort is normal.  Abdominal:     Palpations: Abdomen is soft.  Musculoskeletal:     Cervical back: Normal range of motion.     Comments: Both knees still move about 0-115.  He has medial greater than lateral joint line pain with crepitation.  I do not feel an effusion on either side today.  Hip motion is good and straight leg raise is negative  Skin:    General: Skin is warm and dry.  Neurological:     General: No focal deficit  present.     Mental Status: He is alert and oriented to person, place, and time. Mental status is at baseline.  Psychiatric:        Mood and Affect: Mood normal.        Behavior: Behavior normal.        Thought Content: Thought content normal.        Judgment: Judgment normal.     Vital signs in last 24 hours:    Labs:   Estimated body mass index is 30.25 kg/m as calculated from the following:   Height as of 07/31/22: 6\' 3"  (1.905 m).   Weight as of 07/31/22: 109.8 kg.   Imaging Review Plain radiographs  demonstrate severe degenerative joint disease of the left knee(s). The overall alignment isneutral. The bone quality appears to be good for age and reported activity level.      Assessment/Plan:  End stage primary arthritis, left knee   The patient history, physical examination, clinical judgment of the provider and imaging studies are consistent with end stage degenerative joint disease of the left knee(s) and total knee arthroplasty is deemed medically necessary. The treatment options including medical management, injection therapy arthroscopy and arthroplasty were discussed at length. The risks and benefits of total knee arthroplasty were presented and reviewed. The risks due to aseptic loosening, infection, stiffness, patella tracking problems, thromboembolic complications and other imponderables were discussed. The patient acknowledged the explanation, agreed to proceed with the plan and consent was signed. Patient is being admitted for inpatient treatment for surgery, pain control, PT, OT, prophylactic antibiotics, VTE prophylaxis, progressive ambulation and ADL's and discharge planning. The patient is planning to be discharged home with home health services  Patient's anticipated LOS is less than 2 midnights, meeting these requirements: - Younger than 58 - Lives within 1 hour of care - Has a competent adult at home to recover with post-op recover - NO history of  - Chronic  pain requiring opiods  - Diabetes  - Coronary Artery Disease  - Heart failure  - Heart attack  - Stroke  - DVT/VTE  - Cardiac arrhythmia  - Respiratory Failure/COPD  - Renal failure  - Anemia  - Advanced Liver disease

## 2022-08-04 ENCOUNTER — Encounter (HOSPITAL_COMMUNITY)
Admission: RE | Disposition: A | Discharge status: Home / Self Care | Payer: Self-pay | Source: Home / Self Care | Attending: Orthopaedic Surgery

## 2022-08-04 ENCOUNTER — Ambulatory Visit (HOSPITAL_COMMUNITY): Payer: 59 | Admitting: Certified Registered Nurse Anesthetist

## 2022-08-04 ENCOUNTER — Other Ambulatory Visit: Payer: Self-pay

## 2022-08-04 ENCOUNTER — Ambulatory Visit (HOSPITAL_BASED_OUTPATIENT_CLINIC_OR_DEPARTMENT_OTHER): Payer: 59 | Admitting: Certified Registered Nurse Anesthetist

## 2022-08-04 ENCOUNTER — Encounter (HOSPITAL_COMMUNITY): Payer: Self-pay | Admitting: Orthopaedic Surgery

## 2022-08-04 ENCOUNTER — Ambulatory Visit (HOSPITAL_COMMUNITY)
Admission: RE | Admit: 2022-08-04 | Discharge: 2022-08-04 | Disposition: A | Discharge status: Home / Self Care | Payer: 59 | Attending: Orthopaedic Surgery | Admitting: Orthopaedic Surgery

## 2022-08-04 DIAGNOSIS — M1712 Unilateral primary osteoarthritis, left knee: Secondary | ICD-10-CM | POA: Diagnosis present

## 2022-08-04 DIAGNOSIS — G629 Polyneuropathy, unspecified: Secondary | ICD-10-CM | POA: Insufficient documentation

## 2022-08-04 DIAGNOSIS — R262 Difficulty in walking, not elsewhere classified: Secondary | ICD-10-CM | POA: Diagnosis not present

## 2022-08-04 DIAGNOSIS — G4730 Sleep apnea, unspecified: Secondary | ICD-10-CM | POA: Diagnosis not present

## 2022-08-04 DIAGNOSIS — E039 Hypothyroidism, unspecified: Secondary | ICD-10-CM | POA: Insufficient documentation

## 2022-08-04 DIAGNOSIS — I10 Essential (primary) hypertension: Secondary | ICD-10-CM

## 2022-08-04 DIAGNOSIS — Z87891 Personal history of nicotine dependence: Secondary | ICD-10-CM

## 2022-08-04 DIAGNOSIS — F419 Anxiety disorder, unspecified: Secondary | ICD-10-CM | POA: Diagnosis not present

## 2022-08-04 HISTORY — PX: TOTAL KNEE ARTHROPLASTY: SHX125

## 2022-08-04 SURGERY — ARTHROPLASTY, KNEE, TOTAL
Anesthesia: General | Site: Knee | Laterality: Left

## 2022-08-04 MED ORDER — SODIUM CHLORIDE (PF) 0.9 % IJ SOLN
INTRAMUSCULAR | Status: AC
  Filled 2022-08-04: qty 30

## 2022-08-04 MED ORDER — FENTANYL CITRATE (PF) 100 MCG/2ML IJ SOLN
INTRAMUSCULAR | Status: AC
  Filled 2022-08-04: qty 2

## 2022-08-04 MED ORDER — KETOROLAC TROMETHAMINE 15 MG/ML IJ SOLN
15.0000 mg | Freq: Four times a day (QID) | INTRAMUSCULAR | Status: DC
  Administered 2022-08-04: 15 mg via INTRAVENOUS

## 2022-08-04 MED ORDER — TRANEXAMIC ACID 1000 MG/10ML IV SOLN
INTRAVENOUS | Status: DC | PRN
  Administered 2022-08-04: 2000 mg via TOPICAL

## 2022-08-04 MED ORDER — FENTANYL CITRATE PF 50 MCG/ML IJ SOSY
25.0000 ug | PREFILLED_SYRINGE | INTRAMUSCULAR | Status: DC | PRN
  Administered 2022-08-04: 50 ug via INTRAVENOUS

## 2022-08-04 MED ORDER — TRANEXAMIC ACID 1000 MG/10ML IV SOLN
2000.0000 mg | INTRAVENOUS | Status: DC
  Filled 2022-08-04: qty 20

## 2022-08-04 MED ORDER — BUPIVACAINE IN DEXTROSE 0.75-8.25 % IT SOLN
INTRATHECAL | Status: DC | PRN
  Administered 2022-08-04: 2 mL via INTRATHECAL

## 2022-08-04 MED ORDER — TRANEXAMIC ACID-NACL 1000-0.7 MG/100ML-% IV SOLN
INTRAVENOUS | Status: AC
  Filled 2022-08-04: qty 100

## 2022-08-04 MED ORDER — FENTANYL CITRATE PF 50 MCG/ML IJ SOSY
50.0000 ug | PREFILLED_SYRINGE | Freq: Once | INTRAMUSCULAR | Status: AC
  Administered 2022-08-04: 100 ug via INTRAVENOUS
  Filled 2022-08-04: qty 2

## 2022-08-04 MED ORDER — TIZANIDINE HCL 4 MG PO TABS: 4.0000 mg | ORAL_TABLET | Freq: Four times a day (QID) | ORAL | 1 refills | Status: AC | PRN

## 2022-08-04 MED ORDER — MIDAZOLAM HCL 5 MG/5ML IJ SOLN
INTRAMUSCULAR | Status: DC | PRN
  Administered 2022-08-04: 2 mg via INTRAVENOUS

## 2022-08-04 MED ORDER — PROPOFOL 500 MG/50ML IV EMUL
INTRAVENOUS | Status: AC
Start: 2022-08-04 — End: ?
  Filled 2022-08-04: qty 50

## 2022-08-04 MED ORDER — POVIDONE-IODINE 10 % EX SWAB
2.0000 | Freq: Once | CUTANEOUS | Status: AC
  Administered 2022-08-04: 2 via TOPICAL

## 2022-08-04 MED ORDER — FENTANYL CITRATE PF 50 MCG/ML IJ SOSY
PREFILLED_SYRINGE | INTRAMUSCULAR | Status: AC
  Filled 2022-08-04: qty 1

## 2022-08-04 MED ORDER — LACTATED RINGERS IV SOLN: INTRAVENOUS | Status: DC

## 2022-08-04 MED ORDER — OXYCODONE HCL 5 MG PO TABS
5.0000 mg | ORAL_TABLET | Freq: Once | ORAL | Status: AC | PRN
  Administered 2022-08-04: 5 mg via ORAL

## 2022-08-04 MED ORDER — ACETAMINOPHEN 160 MG/5ML PO SOLN: 325.0000 mg | ORAL | Status: DC | PRN

## 2022-08-04 MED ORDER — 0.9 % SODIUM CHLORIDE (POUR BTL) OPTIME
TOPICAL | Status: DC | PRN
  Administered 2022-08-04: 1000 mL

## 2022-08-04 MED ORDER — CHLORHEXIDINE GLUCONATE 0.12 % MT SOLN
15.0000 mL | Freq: Once | OROMUCOSAL | Status: AC
  Administered 2022-08-04: 15 mL via OROMUCOSAL

## 2022-08-04 MED ORDER — ACETAMINOPHEN 325 MG PO TABS
325.0000 mg | ORAL_TABLET | ORAL | Status: DC | PRN
  Administered 2022-08-04: 650 mg via ORAL

## 2022-08-04 MED ORDER — ROPIVACAINE HCL 7.5 MG/ML IJ SOLN
INTRAMUSCULAR | Status: DC | PRN
  Administered 2022-08-04: 25 mL via PERINEURAL

## 2022-08-04 MED ORDER — HYDROCODONE-ACETAMINOPHEN 5-325 MG PO TABS: 1.0000 | ORAL_TABLET | Freq: Four times a day (QID) | ORAL | 0 refills | Status: AC | PRN

## 2022-08-04 MED ORDER — ONDANSETRON HCL 4 MG/2ML IJ SOLN
INTRAMUSCULAR | Status: DC | PRN
  Administered 2022-08-04: 4 mg via INTRAVENOUS

## 2022-08-04 MED ORDER — PROPOFOL 500 MG/50ML IV EMUL
INTRAVENOUS | Status: DC | PRN
  Administered 2022-08-04: 75 ug/kg/min via INTRAVENOUS

## 2022-08-04 MED ORDER — ACETAMINOPHEN 325 MG PO TABS
ORAL_TABLET | ORAL | Status: AC
  Filled 2022-08-04: qty 2

## 2022-08-04 MED ORDER — HYDROCODONE-ACETAMINOPHEN 7.5-325 MG PO TABS
ORAL_TABLET | ORAL | Status: AC
  Filled 2022-08-04: qty 1

## 2022-08-04 MED ORDER — MIDAZOLAM HCL 2 MG/2ML IJ SOLN
1.0000 mg | Freq: Once | INTRAMUSCULAR | Status: AC
  Administered 2022-08-04: 2 mg via INTRAVENOUS
  Filled 2022-08-04: qty 2

## 2022-08-04 MED ORDER — OXYCODONE HCL 5 MG/5ML PO SOLN: 5.0000 mg | Freq: Once | ORAL | Status: AC | PRN

## 2022-08-04 MED ORDER — CEFAZOLIN SODIUM-DEXTROSE 2-4 GM/100ML-% IV SOLN
2.0000 g | Freq: Four times a day (QID) | INTRAVENOUS | Status: DC
  Administered 2022-08-04: 2 g via INTRAVENOUS

## 2022-08-04 MED ORDER — METHOCARBAMOL 500 MG IVPB - SIMPLE MED
500.0000 mg | Freq: Four times a day (QID) | INTRAVENOUS | Status: DC | PRN
  Administered 2022-08-04: 500 mg via INTRAVENOUS

## 2022-08-04 MED ORDER — KETOROLAC TROMETHAMINE 15 MG/ML IJ SOLN
INTRAMUSCULAR | Status: AC
  Filled 2022-08-04: qty 1

## 2022-08-04 MED ORDER — PROPOFOL 1000 MG/100ML IV EMUL
INTRAVENOUS | Status: AC
  Filled 2022-08-04: qty 200

## 2022-08-04 MED ORDER — METHOCARBAMOL 500 MG IVPB - SIMPLE MED
INTRAVENOUS | Status: AC
  Filled 2022-08-04: qty 55

## 2022-08-04 MED ORDER — LACTATED RINGERS IV BOLUS
500.0000 mL | Freq: Once | INTRAVENOUS | Status: AC
  Administered 2022-08-04: 500 mL via INTRAVENOUS

## 2022-08-04 MED ORDER — MIDAZOLAM HCL 2 MG/2ML IJ SOLN
INTRAMUSCULAR | Status: AC
  Filled 2022-08-04: qty 2

## 2022-08-04 MED ORDER — BUPIVACAINE-EPINEPHRINE (PF) 0.5% -1:200000 IJ SOLN
INTRAMUSCULAR | Status: AC
  Filled 2022-08-04: qty 30

## 2022-08-04 MED ORDER — METHOCARBAMOL 500 MG PO TABS: 500.0000 mg | ORAL_TABLET | Freq: Four times a day (QID) | ORAL | Status: DC | PRN

## 2022-08-04 MED ORDER — HYDROCODONE-ACETAMINOPHEN 7.5-325 MG PO TABS
1.0000 | ORAL_TABLET | ORAL | Status: DC | PRN
  Administered 2022-08-04: 1 via ORAL

## 2022-08-04 MED ORDER — SODIUM CHLORIDE 0.9 % IR SOLN
Status: DC | PRN
  Administered 2022-08-04: 3000 mL

## 2022-08-04 MED ORDER — DEXAMETHASONE SODIUM PHOSPHATE 10 MG/ML IJ SOLN
INTRAMUSCULAR | Status: DC | PRN
  Administered 2022-08-04: 8 mg via INTRAVENOUS

## 2022-08-04 MED ORDER — TRANEXAMIC ACID-NACL 1000-0.7 MG/100ML-% IV SOLN
1000.0000 mg | INTRAVENOUS | Status: AC
  Administered 2022-08-04: 1000 mg via INTRAVENOUS
  Filled 2022-08-04: qty 100

## 2022-08-04 MED ORDER — ONDANSETRON HCL 4 MG/2ML IJ SOLN: 4.0000 mg | Freq: Once | INTRAMUSCULAR | Status: DC | PRN

## 2022-08-04 MED ORDER — DEXAMETHASONE SODIUM PHOSPHATE 10 MG/ML IJ SOLN
INTRAMUSCULAR | Status: DC | PRN
  Administered 2022-08-04: 10 mg

## 2022-08-04 MED ORDER — LACTATED RINGERS IV BOLUS
250.0000 mL | Freq: Once | INTRAVENOUS | Status: AC
  Administered 2022-08-04: 250 mL via INTRAVENOUS

## 2022-08-04 MED ORDER — CEFAZOLIN SODIUM-DEXTROSE 2-4 GM/100ML-% IV SOLN
2.0000 g | INTRAVENOUS | Status: AC
  Administered 2022-08-04: 2 g via INTRAVENOUS
  Filled 2022-08-04: qty 100

## 2022-08-04 MED ORDER — PROPOFOL 500 MG/50ML IV EMUL
INTRAVENOUS | Status: AC
  Filled 2022-08-04: qty 50

## 2022-08-04 MED ORDER — BUPIVACAINE LIPOSOME 1.3 % IJ SUSP: 20.0000 mL | Freq: Once | INTRAMUSCULAR | Status: DC

## 2022-08-04 MED ORDER — TRANEXAMIC ACID-NACL 1000-0.7 MG/100ML-% IV SOLN
1000.0000 mg | Freq: Once | INTRAVENOUS | Status: AC
  Administered 2022-08-04: 1000 mg via INTRAVENOUS

## 2022-08-04 MED ORDER — OXYCODONE HCL 5 MG PO TABS
ORAL_TABLET | ORAL | Status: AC
  Filled 2022-08-04: qty 1

## 2022-08-04 MED ORDER — MEPERIDINE HCL 50 MG/ML IJ SOLN: 6.2500 mg | INTRAMUSCULAR | Status: DC | PRN

## 2022-08-04 MED ORDER — DEXAMETHASONE SODIUM PHOSPHATE 10 MG/ML IJ SOLN
INTRAMUSCULAR | Status: AC
  Filled 2022-08-04: qty 1

## 2022-08-04 MED ORDER — BUPIVACAINE LIPOSOME 1.3 % IJ SUSP
INTRAMUSCULAR | Status: AC
  Filled 2022-08-04: qty 20

## 2022-08-04 MED ORDER — ASPIRIN 81 MG PO CHEW: 81.0000 mg | CHEWABLE_TABLET | Freq: Two times a day (BID) | ORAL | 0 refills | Status: AC

## 2022-08-04 MED ORDER — PROPOFOL 10 MG/ML IV BOLUS
INTRAVENOUS | Status: DC | PRN
  Administered 2022-08-04 (×5): 20 mg via INTRAVENOUS

## 2022-08-04 MED ORDER — ONDANSETRON HCL 4 MG/2ML IJ SOLN
INTRAMUSCULAR | Status: AC
  Filled 2022-08-04: qty 2

## 2022-08-04 MED ORDER — ORAL CARE MOUTH RINSE: 15.0000 mL | Freq: Once | OROMUCOSAL | Status: AC

## 2022-08-04 MED ORDER — CEFAZOLIN SODIUM-DEXTROSE 2-4 GM/100ML-% IV SOLN
INTRAVENOUS | Status: AC
  Filled 2022-08-04: qty 100

## 2022-08-04 SURGICAL SUPPLY — 55 items
ATTUNE MED DOME PAT 41 KNEE (Knees) IMPLANT
ATTUNE PS FEM LT SZ 8 CEM KNEE (Femur) IMPLANT
ATTUNE PSRP INSR SZ8 6 KNEE (Insert) IMPLANT
BAG COUNTER SPONGE SURGICOUNT (BAG) ×1 IMPLANT
BAG DECANTER FOR FLEXI CONT (MISCELLANEOUS) ×1 IMPLANT
BAG SPEC THK2 15X12 ZIP CLS (MISCELLANEOUS) ×1
BAG SPNG CNTER NS LX DISP (BAG) ×1
BAG ZIPLOCK 12X15 (MISCELLANEOUS) ×1 IMPLANT
BASE TIBIAL ROT PLAT SZ 10 KNE (Miscellaneous) IMPLANT
BLADE SAGITTAL 25.0X1.19X90 (BLADE) ×1 IMPLANT
BLADE SAW SGTL 11.0X1.19X90.0M (BLADE) ×1 IMPLANT
BLADE SURG SZ10 CARB STEEL (BLADE) ×1 IMPLANT
BNDG ELASTIC 6X5.8 VLCR STR LF (GAUZE/BANDAGES/DRESSINGS) ×1 IMPLANT
BOOTIES KNEE HIGH SLOAN (MISCELLANEOUS) ×1 IMPLANT
BOWL SMART MIX CTS (DISPOSABLE) ×1 IMPLANT
BSPLAT TIB 10 CMNT ROT PLAT (Miscellaneous) ×1 IMPLANT
CEMENT HV SMART SET (Cement) ×2 IMPLANT
COVER SURGICAL LIGHT HANDLE (MISCELLANEOUS) ×1 IMPLANT
CUFF TOURN SGL QUICK 34 (TOURNIQUET CUFF) ×1
CUFF TRNQT CYL 34X4.125X (TOURNIQUET CUFF) ×1 IMPLANT
DRAPE INCISE IOBAN 66X45 STRL (DRAPES) ×1 IMPLANT
DRAPE SHEET LG 3/4 BI-LAMINATE (DRAPES) ×1 IMPLANT
DRAPE TOP 10253 STERILE (DRAPES) ×1 IMPLANT
DRAPE U-SHAPE 47X51 STRL (DRAPES) ×1 IMPLANT
DRSG AQUACEL AG ADV 3.5X10 (GAUZE/BANDAGES/DRESSINGS) ×1 IMPLANT
DURAPREP 26ML APPLICATOR (WOUND CARE) ×2 IMPLANT
ELECT REM PT RETURN 15FT ADLT (MISCELLANEOUS) ×1 IMPLANT
GLOVE BIO SURGEON STRL SZ8 (GLOVE) ×2 IMPLANT
GLOVE BIOGEL PI IND STRL 8 (GLOVE) ×2 IMPLANT
GLOVE BIOGEL PI INDICATOR 8 (GLOVE) ×2
GOWN STRL REUS W/ TWL XL LVL3 (GOWN DISPOSABLE) ×2 IMPLANT
GOWN STRL REUS W/TWL XL LVL3 (GOWN DISPOSABLE) ×2
HANDPIECE INTERPULSE COAX TIP (DISPOSABLE) ×1
HOLDER FOLEY CATH W/STRAP (MISCELLANEOUS) IMPLANT
HOOD PEEL AWAY FLYTE STAYCOOL (MISCELLANEOUS) ×3 IMPLANT
KIT TURNOVER KIT A (KITS) IMPLANT
MANIFOLD NEPTUNE II (INSTRUMENTS) ×1 IMPLANT
NEEDLE HYPO 22GX1.5 SAFETY (NEEDLE) ×1 IMPLANT
NS IRRIG 1000ML POUR BTL (IV SOLUTION) ×1 IMPLANT
PACK TOTAL KNEE CUSTOM (KITS) ×1 IMPLANT
PAD ARMBOARD 7.5X6 YLW CONV (MISCELLANEOUS) ×1 IMPLANT
PROTECTOR NERVE ULNAR (MISCELLANEOUS) ×1 IMPLANT
SET HNDPC FAN SPRY TIP SCT (DISPOSABLE) ×1 IMPLANT
SPIKE FLUID TRANSFER (MISCELLANEOUS) ×2 IMPLANT
SUT ETHIBOND NAB CT1 #1 30IN (SUTURE) ×2 IMPLANT
SUT VIC AB 0 CT1 36 (SUTURE) ×1 IMPLANT
SUT VIC AB 2-0 CT1 27 (SUTURE) ×1
SUT VIC AB 2-0 CT1 TAPERPNT 27 (SUTURE) ×1 IMPLANT
SUT VICRYL AB 3-0 FS1 BRD 27IN (SUTURE) ×1 IMPLANT
SUT VLOC 180 0 24IN GS25 (SUTURE) ×1 IMPLANT
TIBIAL BASE ROT PLAT SZ 10 KNE (Miscellaneous) ×1 IMPLANT
TRAY FOLEY MTR SLVR 16FR STAT (SET/KITS/TRAYS/PACK) IMPLANT
WATER STERILE IRR 1000ML POUR (IV SOLUTION) ×1 IMPLANT
WRAP KNEE MAXI GEL POST OP (GAUZE/BANDAGES/DRESSINGS) ×1 IMPLANT
YANKAUER SUCT BULB TIP NO VENT (SUCTIONS) ×1 IMPLANT

## 2022-08-04 NOTE — Evaluation (Signed)
Physical Therapy Evaluation Patient Details Name: [name] [name] MRN: [id number] DOB: [birth year] Today's Date: 08/04/2022  History of Present Illness  Pt is a 61yo male presenting s/p L-TKA on 08/04/22. PMH: anxiety, HTN, hypothyroidism, neuropathy L foot, anterior lumbar fusion L5-s1 2018, R shoulder reconstruction.   Clinical Impression  [name] [name] is a 60 y.o. male POD 0 s/p L-TKA. Patient reports independence with mobility at baseline. Patient is now limited by functional impairments (see PT problem list below) and requires min assist for transfers; pt unable to fully weightbear on LLE as anesthesia had not quite cleared, further mobility deferred. Patient instructed in exercises to facilitate ROM and circulation. Patient will benefit from continued skilled PT interventions to address impairments and progress towards PLOF. Will try a second session to attempt same-day discharge. We will continue to follow if pt continues acute stay to progress towards Mod I goals.         Recommendations for follow up therapy are one component of a multi-disciplinary discharge planning process, led by the attending physician.  Recommendations may be updated based on patient status, additional functional criteria and insurance authorization.  Follow Up Recommendations Follow physician's recommendations for discharge plan and follow up therapies      Assistance Recommended at Discharge Set up Supervision/Assistance  Patient can return home with the following  A little help with walking and/or transfers;A little help with bathing/dressing/bathroom;Assistance with cooking/housework;Assist for transportation;Help with stairs or ramp for entrance    Equipment Recommendations None recommended by PT  Recommendations for Other Services       Functional Status Assessment Patient has had a recent decline in their functional status and demonstrates the ability to make significant improvements in function  in a reasonable and predictable amount of time.     Precautions / Restrictions Precautions Precautions: Fall Restrictions Weight Bearing Restrictions: No Other Position/Activity Restrictions: wbat      Mobility  Bed Mobility Overal bed mobility: Needs Assistance Bed Mobility: Supine to Sit     Supine to sit: Supervision     General bed mobility comments: SUP for safety, no physical assist required    Transfers Overall transfer level: Needs assistance Equipment used: Rolling walker (2 wheels) Transfers: Sit to/from Stand Sit to Stand: Min assist, From elevated surface           General transfer comment: Pt completed sit to stand with min assist for steadying. Pt unable to bear weight on LLE without min-mod assist, further mobility deferred.    Ambulation/Gait               General Gait Details: deferred  Stairs            Wheelchair Mobility    Modified Rankin (Stroke Patients Only)       Balance Overall balance assessment: Needs assistance Sitting-balance support: Feet supported, Bilateral upper extremity supported Sitting balance-Leahy Scale: Good   Postural control: Other (comment) (anterior lean in standing) Standing balance support: Reliant on assistive device for balance, During functional activity, Bilateral upper extremity supported Standing balance-Leahy Scale: Poor                               Pertinent Vitals/Pain Pain Assessment Pain Assessment: No/denies pain    Home Living Family/patient expects to be discharged to:: Private residence Living Arrangements: Spouse/significant other;Other relatives Available Help at Discharge: Family;Available 24 hours/day Type of Home: House Home Access: Stairs to enter Entrance  Stairs-Rails: None Entrance Stairs-Number of Steps: 3   Home Layout: One level Home Equipment: Agricultural consultant (2 wheels);Cane - single point      Prior Function Prior Level of Function :  Independent/Modified Independent;Driving;Working/employed             Mobility Comments: ind ADLs Comments: ind     Hand Dominance        Extremity/Trunk Assessment   Upper Extremity Assessment Upper Extremity Assessment: Overall WFL for tasks assessed    Lower Extremity Assessment Lower Extremity Assessment: RLE deficits/detail;LLE deficits/detail RLE Deficits / Details: MMT ank DF/PF 5/5 RLE Sensation: WNL LLE Deficits / Details: MMT ank DF/PF UNABLE, able to perform straight leg raise LLE Sensation: decreased light touch (anesthesia)    Cervical / Trunk Assessment Cervical / Trunk Assessment: Back Surgery  Communication   Communication: No difficulties  Cognition Arousal/Alertness: Awake/alert Behavior During Therapy: WFL for tasks assessed/performed Overall Cognitive Status: Within Functional Limits for tasks assessed                                          General Comments General comments (skin integrity, edema, etc.): Wife dawn present    Exercises Total Joint Exercises Ankle Circles/Pumps: AROM, Right, 10 reps, Supine (L limited) Quad Sets: AROM, Left, 5 reps Short Arc Quad: AROM, Left, Other reps (comment) (5) Heel Slides: AROM, Left, 5 reps Hip ABduction/ADduction: AROM, Left, 5 reps Straight Leg Raises: AROM, Left, 5 reps   Assessment/Plan    PT Assessment Patient needs continued PT services  PT Problem List Decreased strength;Decreased range of motion;Decreased activity tolerance;Decreased balance;Decreased mobility;Decreased coordination;Pain       PT Treatment Interventions DME instruction;Gait training;Stair training;Functional mobility training;Therapeutic activities;Therapeutic exercise;Balance training;Neuromuscular re-education;Patient/family education    PT Goals (Current goals can be found in the Care Plan section)  Acute Rehab PT Goals Patient Stated Goal: To walk without pain PT Goal Formulation: With  patient Time For Goal Achievement: 08/11/22 Potential to Achieve Goals: Good    Frequency 7X/week     Co-evaluation               AM-PAC PT "6 Clicks" Mobility  Outcome Measure Help needed turning from your back to your side while in a flat bed without using bedrails?: None Help needed moving from lying on your back to sitting on the side of a flat bed without using bedrails?: A Little Help needed moving to and from a bed to a chair (including a wheelchair)?: A Little Help needed standing up from a chair using your arms (e.g., wheelchair or bedside chair)?: A Little Help needed to walk in hospital room?: A Little Help needed climbing 3-5 steps with a railing? : A Little 6 Click Score: 19    End of Session Equipment Utilized During Treatment: Gait belt Activity Tolerance: Treatment limited secondary to medical complications (Comment) (Pt unable to weightbear) Patient left: in bed;with call bell/phone within reach Nurse Communication: Mobility status PT Visit Diagnosis: Pain;Difficulty in walking, not elsewhere classified (R26.2) Pain - Right/Left: Left Pain - part of body: Knee    Time: 8676-1950 PT Time Calculation (min) (ACUTE ONLY): 35 min   Charges:   PT Evaluation $PT Eval Low Complexity: 1 Low PT Treatments $Therapeutic Exercise: 8-22 mins        Jamesetta Geralds, PT, DPT [facility] Department Office: [phone] Pager: [phone]  Jamesetta Geralds 08/04/2022, 5:30 PM

## 2022-08-04 NOTE — Anesthesia Procedure Notes (Signed)
Procedure Name: MAC Date/Time: 08/04/2022 12:32 PM  Performed by: West Pugh, CRNAPre-anesthesia Checklist: Patient identified, Emergency Drugs available, Suction available, Patient being monitored and Timeout performed Patient Re-evaluated:Patient Re-evaluated prior to induction Oxygen Delivery Method: Simple face mask Preoxygenation: Pre-oxygenation with 100% oxygen Placement Confirmation: positive ETCO2 Dental Injury: Teeth and Oropharynx as per pre-operative assessment

## 2022-08-04 NOTE — Progress Notes (Signed)
Physical Therapy Treatment Patient Details Name: [name] [name] MRN: [id number] DOB: 09-08-1961 Today's Date: 08/04/2022     Clinical Impression  Pt seen for second visit POD0 to attempt same-day discharge from PACU. Pt demonstrated L ankle DF/PF active range of motion similar to R with 5/5 strength. In standing, pt demonstrated adequate weightbearing through LLE for safe mobility. Pt requires min guard for transfers and gait with RW. Patient was able to ambulate 100 feet with RW and min guard and cues for safe walker management. Patient educated on safe sequencing for stair mobility and verbalized safe guarding position for people assisting with mobility. Patient instructed in exercises to facilitate ROM and circulation. Patient will benefit from continued skilled PT interventions to address impairments and progress towards PLOF. Patient has met mobility goals at adequate level for discharge home; will continue to follow if pt continues acute stay to progress towards Mod I goals.           08/04/22 1841  PT Visit Information  Last PT Received On 08/04/22  Assistance Needed +1  History of Present Illness Pt is a 61yo male presenting s/p L-TKA on 08/04/22. PMH: anxiety, HTN, hypothyroidism, neuropathy L foot, anterior lumbar fusion L5-s1 2018, R shoulder reconstruction.  Subjective Data  Subjective I'm moving better  Patient Stated Goal To walk without pain  Precautions  Precautions Fall  Restrictions  Weight Bearing Restrictions No  Other Position/Activity Restrictions wbat  Pain Assessment  Pain Assessment No/denies pain  Cognition  Arousal/Alertness Awake/alert  Behavior During Therapy WFL for tasks assessed/performed  Overall Cognitive Status Within Functional Limits for tasks assessed  Bed Mobility  Overal bed mobility Needs Assistance  Bed Mobility Supine to Sit  Supine to sit Supervision  General bed mobility comments SUP for safety, no physical assist required  Transfers   Overall transfer level Needs assistance  Equipment used Rolling walker (2 wheels)  Transfers Sit to/from Stand  Sit to Stand From elevated surface;Min guard  General transfer comment Min guard from stretcher, no physical assist required, pt demonstrated appropriate weightbearing on LLE in standing.  Ambulation/Gait  Ambulation/Gait assistance Min guard  Gait Distance (Feet) 100 Feet  Assistive device Rolling walker (2 wheels)  Gait Pattern/deviations WFL(Within Functional Limits)  General Gait Details Pt ambulated 174f with RW and min guard, no physical assist required or overt LOB noted.  Gait velocity decreased  Stairs Yes  Stairs assistance Min assist  Stair Management No rails;Step to pattern;Backwards;With walker  Number of Stairs 2  General stair comments Educated pt on backwards stair mobility using RW and min assist of another person, provided handout, no questions, pt verbalized understanding. Demonstrated safe mobility with min assist for steadying, no overt LOB noted, verbal cuing for sequencing.  Balance  Overall balance assessment Needs assistance  Sitting-balance support Feet supported;Bilateral upper extremity supported  Sitting balance-Leahy Scale Good  Standing balance support Reliant on assistive device for balance;During functional activity;Bilateral upper extremity supported  Standing balance-Leahy Scale Poor  General Comments  General comments (skin integrity, edema, etc.) Pt demonstrated MMT of L ank PF/DF 5/5, straight leg raise.  PT - End of Session  Equipment Utilized During Treatment Gait belt  Activity Tolerance Patient tolerated treatment well;No increased pain  Patient left with call bell/phone within reach;in chair;with nursing/sitter in room  Nurse Communication Mobility status   PT - Assessment/Plan  PT Plan Current plan remains appropriate  PT Visit Diagnosis Pain;Difficulty in walking, not elsewhere classified (R26.2)  Pain - Right/Left Left  Pain - part of body Knee  PT Frequency (ACUTE ONLY) 7X/week  Follow Up Recommendations Follow physician's recommendations for discharge plan and follow up therapies  Assistance recommended at discharge Set up Supervision/Assistance  Patient can return home with the following A little help with walking and/or transfers;A little help with bathing/dressing/bathroom;Assistance with cooking/housework;Assist for transportation;Help with stairs or ramp for entrance  PT equipment None recommended by PT  AM-PAC PT "6 Clicks" Mobility Outcome Measure (Version 2)  Help needed turning from your back to your side while in a flat bed without using bedrails? 4  Help needed moving from lying on your back to sitting on the side of a flat bed without using bedrails? 3  Help needed moving to and from a bed to a chair (including a wheelchair)? 3  Help needed standing up from a chair using your arms (e.g., wheelchair or bedside chair)? 3  Help needed to walk in hospital room? 3  Help needed climbing 3-5 steps with a railing?  3  6 Click Score 19  Consider Recommendation of Discharge To: Home with [facility]  Progressive Mobility  What is the highest level of mobility based on the progressive mobility assessment? Level 5 (Walks with assist in room/hall) - Balance while stepping forward/back and can walk in room with assist - Complete  Activity Ambulated with assistance in hallway  Acute Rehab PT Goals  PT Goal Formulation With patient  Time For Goal Achievement 08/11/22  Potential to Achieve Goals Good  PT Time Calculation  PT Start Time (ACUTE ONLY) 1816  PT Stop Time (ACUTE ONLY) 1839  PT Time Calculation (min) (ACUTE ONLY) 23 min  PT General Charges  $$ ACUTE PT VISIT 1 Visit  PT Treatments  $Gait Training 23-37 mins   Coolidge Breeze, PT, DPT [facility] Department Office: [phone] Pager: [phone]

## 2022-08-04 NOTE — Op Note (Signed)
PREOP DIAGNOSIS: DJD LEFT KNEE POSTOP DIAGNOSIS:  same PROCEDURE: LEFT TKR ANESTHESIA: Spinal and MAC ATTENDING SURGEON: Hessie Dibble ASSISTANT: Loni Dolly PA  INDICATIONS FOR PROCEDURE: [name] [name] is a 61 y.o. male who has struggled for a long time with pain due to degenerative arthritis of the left knee.  The patient has failed many conservative non-operative measures and at this point has pain which limits the ability to sleep and walk.  The patient is offered total knee replacement.  Informed operative consent was obtained after discussion of possible risks of anesthesia, infection, neurovascular injury, DVT, and death.  The importance of the post-operative rehabilitation protocol to optimize result was stressed extensively with the patient.  SUMMARY OF FINDINGS AND PROCEDURE:  [name] [name] was taken to the operative suite where under the above anesthesia a left knee replacement was performed.  There were advanced degenerative changes and the bone quality was excellent.  We used the DePuyAttune system and placed size 8 femur, 10 tibia, 41 mm all polyethylene patella, and a size 6 mm spacer.  Loni Dolly PA-C assisted throughout and was invaluable to the completion of the case in that he helped retract and maintain exposure while I placed the components.  He also helped close thereby minimizing OR time.  The patient was admitted for appropriate post-op care to include perioperative antibiotics and mechanical and pharmacologic measures for DVT prophylaxis.  DESCRIPTION OF PROCEDURE:  [name] [name] was taken to the operative suite where the above anesthesia was applied.  The patient was positioned supine and prepped and draped in normal sterile fashion.  An appropriate time out was performed.  After the administration of kefzol pre-op antibiotic the leg was elevated and exsanguinated and a tourniquet inflated.  A standard longitudinal incision was made on the anterior knee.   Dissection was carried down to the extensor mechanism.  All appropriate anti-infective measures were used including the pre-operative antibiotic, betadine impregnated drape, and closed hooded exhaust systems for each member of the surgical team.  A medial parapatellar incision was made in the extensor mechanism and the knee cap flipped and the knee flexed.  Some residual meniscal tissues were removed along with any remaining ACL/PCL tissue.  A guide was placed on the tibia and a flat cut was made on it's superior surface.  An intramedullary guide was placed in the femur and was utilized to make anterior and posterior cuts creating an appropriate flexion gap.  A second intramedullary guide was placed in the femur to make a distal cut properly balancing the knee with an extension gap equal to the flexion gap.  The three bones sized to the above mentioned sizes and the appropriate guides were placed and utilized.  A trial reduction was done and the knee easily came to full extension and the patella tracked well on flexion.  The trial components were removed and all bones were cleaned with pulsatile lavage and then dried thoroughly.  Cement was mixed and was pressurized onto the bones followed by placement of the aforementioned components.  Excess cement was trimmed and pressure was held on the components until the cement had hardened.  The tourniquet was deflated and a small amount of bleeding was controlled with cautery and pressure.  The knee was irrigated thoroughly.  The extensor mechanism was re-approximated with #1 ethibond in interrupted fashion.  The knee was flexed and the repair was solid.  The subcutaneous tissues were re-approximated with #0 and #2-0 vicryl and the skin  closed with a subcuticular stitch and steristrips.  A sterile dressing was applied.  Intraoperative fluids, EBL, and tourniquet time can be obtained from anesthesia records.  DISPOSITION:  The patient was taken to recovery room in stable  condition and scheduled to potentially go home same day depending on ability to walk and tolerate liquids.  Hessie Dibble 08/04/2022, 2:11 PM

## 2022-08-04 NOTE — Interval H&P Note (Signed)
History and Physical Interval Note:  08/04/2022 11:47 AM  [name] [name]  has presented today for surgery, with the diagnosis of LEFT KNEE DEGENEATIVE JOINT DISEASE.  The various methods of treatment have been discussed with the patient and family. After consideration of risks, benefits and other options for treatment, the patient has consented to  Procedure(s): LEFT TOTAL KNEE ARTHROPLASTY (Left) as a surgical intervention.  The patient's history has been reviewed, patient examined, no change in status, stable for surgery.  I have reviewed the patient's chart and labs.  Questions were answered to the patient's satisfaction.     Velna Ochs

## 2022-08-04 NOTE — Transfer of Care (Signed)
Immediate Anesthesia Transfer of Care Note  Patient: ANEL PUROHIT  Procedure(s) Performed: LEFT TOTAL KNEE ARTHROPLASTY (Left: Knee)  Patient Location: PACU  Anesthesia Type:Spinal and MAC combined with regional for post-op pain  Level of Consciousness: drowsy and patient cooperative  Airway & Oxygen Therapy: Patient Spontanous Breathing and Patient connected to face mask oxygen  Post-op Assessment: Report given to RN and Post -op Vital signs reviewed and stable  Post vital signs: Reviewed and stable  Last Vitals:  Vitals Value Taken Time  BP 131/80 08/04/22 1436  Temp    Pulse 78 08/04/22 1438  Resp 14 08/04/22 1438  SpO2 100 % 08/04/22 1438  Vitals shown include unvalidated device data.  Last Pain:  Vitals:   08/04/22 1150  TempSrc:   PainSc: 0-No pain         Complications: No notable events documented.

## 2022-08-04 NOTE — Anesthesia Postprocedure Evaluation (Signed)
Anesthesia Post Note  Patient: [name] [name]  Procedure(s) Performed: LEFT TOTAL KNEE ARTHROPLASTY (Left: Knee)     Patient location during evaluation: PACU Anesthesia Type: MAC and Spinal Level of consciousness: oriented and awake and alert Pain management: pain level controlled Vital Signs Assessment: post-procedure vital signs reviewed and stable Respiratory status: spontaneous breathing, respiratory function stable and patient connected to nasal cannula oxygen Cardiovascular status: blood pressure returned to baseline and stable Postop Assessment: no headache, no backache and no apparent nausea or vomiting Anesthetic complications: no   No notable events documented.  Last Vitals:  Vitals:   08/04/22 2015 08/04/22 2100  BP: (!) 161/99 (!) 162/90  Pulse: (!) 108 100  Resp: 18   Temp:  36.6 C  SpO2:  98%    Last Pain:  Vitals:   08/04/22 2100  TempSrc:   PainSc: 4                  Haile Toppins

## 2022-08-04 NOTE — Anesthesia Procedure Notes (Addendum)
Spinal  Patient location during procedure: OR Start time: 08/04/2022 12:34 PM End time: 08/04/2022 12:39 PM Reason for block: surgical anesthesia Staffing Performed: resident/CRNA  Resident/CRNA: West Pugh, CRNA Performed by: West Pugh, CRNA Authorized by: Janeece Riggers, MD   Preanesthetic Checklist Completed: patient identified, IV checked, site marked, risks and benefits discussed, surgical consent, monitors and equipment checked, pre-op evaluation and timeout performed Spinal Block Patient position: sitting Prep: DuraPrep Patient monitoring: heart rate, cardiac monitor, continuous pulse ox and blood pressure Approach: midline Location: L3-4 Injection technique: single-shot Needle Needle type: Pencan and Introducer  Needle gauge: 24 G Needle length: 10 cm Assessment Sensory level: T4 Events: CSF return Additional Notes IV functioning, monitors applied to pt. Expiration date of kit checked and confirmed to be in date. Sterile prep and drape, hand hygiene and sterile gloved used. Pt was positioned and spine was prepped in sterile fashion. Skin was anesthetized with lidocaine. Free flow of clear CSF obtained prior to injecting local anesthetic into CSF x 1 attempt. Spinal needle aspirated freely following injection. Needle was carefully withdrawn, and pt tolerated procedure well. Loss of motor and sensory on exam post injection. Dr Ambrose Pancoast present for procedure. Plt 342,000. Patient denies use of anticoagulants

## 2022-08-04 NOTE — Anesthesia Procedure Notes (Signed)
Anesthesia Regional Block: Adductor canal block   Pre-Anesthetic Checklist: , timeout performed,  Correct Patient, Correct Site, Correct Laterality,  Correct Procedure, Correct Position, site marked,  Risks and benefits discussed,  Surgical consent,  Pre-op evaluation,  At surgeon's request and post-op pain management  Laterality: Left  Prep: chloraprep       Needles:  Injection technique: Single-shot  Needle Type: Echogenic Stimulator Needle     Needle Length: 5cm  Needle Gauge: 22     Additional Needles:   Procedures:, nerve stimulator,,, ultrasound used (permanent image in chart),,    Narrative:  Start time: 08/04/2022 11:50 AM End time: 08/04/2022 11:55 AM Injection made incrementally with aspirations every 5 mL.  Performed by: Personally  Anesthesiologist: Bethena Midget, MD  Additional Notes: Functioning IV was confirmed and monitors were applied.  A 49mm 22ga Arrow echogenic stimulator needle was used. Sterile prep and drape,hand hygiene and sterile gloves were used. Ultrasound guidance: relevant anatomy identified, needle position confirmed, local anesthetic spread visualized around nerve(s)., vascular puncture avoided.  Image printed for medical record. Negative aspiration and negative test dose prior to incremental administration of local anesthetic. The patient tolerated the procedure well.

## 2022-08-04 NOTE — Anesthesia Preprocedure Evaluation (Addendum)
Anesthesia Evaluation  Patient identified by MRN, date of birth, ID band Patient awake    Reviewed: Allergy & Precautions, NPO status , Patient's Chart, lab work & pertinent test results  History of Anesthesia Complications Negative for: history of anesthetic complications  Airway Mallampati: II       Dental  (+) Partial Upper,    Pulmonary sleep apnea , former smoker,    breath sounds clear to auscultation       Cardiovascular hypertension, Pt. on medications  Rhythm:Regular Rate:Normal  ECG: NSR, rate 97   Neuro/Psych Anxiety  Neuromuscular disease negative psych ROS   GI/Hepatic negative GI ROS, Neg liver ROS,   Endo/Other  Hypothyroidism   Renal/GU negative Renal ROS  negative genitourinary   Musculoskeletal  (+) Arthritis , Osteoarthritis,  Left leg pain Neuropathy   Abdominal   Peds negative pediatric ROS (+)  Hematology negative hematology ROS (+)   Anesthesia Other Findings   Reproductive/Obstetrics negative OB ROS                            Anesthesia Physical  Anesthesia Plan  ASA: 3  Anesthesia Plan: General   Post-op Pain Management: Minimal or no pain anticipated   Induction: Intravenous  PONV Risk Score and Plan: 3 and Ondansetron, Dexamethasone and Treatment may vary due to age or medical condition  Airway Management Planned: Oral ETT and LMA  Additional Equipment: None  Intra-op Plan:   Post-operative Plan: Extubation in OR  Informed Consent:     Dental advisory given  Plan Discussed with: Anesthesiologist and CRNA  Anesthesia Plan Comments:         Anesthesia Quick Evaluation

## 2022-08-05 ENCOUNTER — Encounter (HOSPITAL_COMMUNITY): Payer: Self-pay | Admitting: Orthopaedic Surgery

## 2022-09-13 DEATH — deceased
